# Patient Record
Sex: Male | Born: 1975 | Race: White | Hispanic: No | Marital: Married | Smoking: Current every day smoker
Health system: Southern US, Community
[De-identification: ages and names within clinical notes are randomized; demographics above are authoritative.]

---

## 2004-06-27 ENCOUNTER — Emergency Department: Payer: Self-pay | Admitting: Emergency Medicine

## 2006-06-11 ENCOUNTER — Ambulatory Visit: Payer: Self-pay | Admitting: Specialist

## 2008-03-03 ENCOUNTER — Emergency Department: Payer: Self-pay | Admitting: Emergency Medicine

## 2011-05-26 ENCOUNTER — Ambulatory Visit: Payer: Self-pay | Admitting: Family Medicine

## 2012-05-20 ENCOUNTER — Encounter (HOSPITAL_COMMUNITY): Payer: Self-pay

## 2012-05-20 ENCOUNTER — Emergency Department (INDEPENDENT_AMBULATORY_CARE_PROVIDER_SITE_OTHER)
Admission: EM | Admit: 2012-05-20 | Discharge: 2012-05-20 | Disposition: A | Discharge status: Home / Self Care | Payer: BC Managed Care – PPO | Source: Home / Self Care | Attending: Family Medicine | Admitting: Family Medicine

## 2012-05-20 DIAGNOSIS — S39012A Strain of muscle, fascia and tendon of lower back, initial encounter: Secondary | ICD-10-CM

## 2012-05-20 DIAGNOSIS — M549 Dorsalgia, unspecified: Secondary | ICD-10-CM

## 2012-05-20 MED ORDER — KETOROLAC TROMETHAMINE 60 MG/2ML IM SOLN
INTRAMUSCULAR | Status: AC
  Filled 2012-05-20: qty 2

## 2012-05-20 MED ORDER — CYCLOBENZAPRINE HCL 10 MG PO TABS: 10.0000 mg | ORAL_TABLET | Freq: Two times a day (BID) | ORAL | Status: DC | PRN

## 2012-05-20 MED ORDER — TRAMADOL HCL 50 MG PO TABS: 50.0000 mg | ORAL_TABLET | Freq: Four times a day (QID) | ORAL | Status: DC | PRN

## 2012-05-20 MED ORDER — METHYLPREDNISOLONE 4 MG PO KIT: PACK | ORAL | Status: DC

## 2012-05-20 MED ORDER — KETOROLAC TROMETHAMINE 60 MG/2ML IM SOLN
60.0000 mg | Freq: Once | INTRAMUSCULAR | Status: AC
  Administered 2012-05-20: 60 mg via INTRAMUSCULAR

## 2012-05-20 NOTE — ED Notes (Signed)
At d/c , pt upset he had not received a rx for narcotic for his back pain. I"LL NEVER COME BACK HERE AGAIN!!!

## 2012-05-20 NOTE — ED Notes (Addendum)
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 Reportedly felt sharp pain in back which shot into his buttocks and into his leg. "I've had back problems before, but this is the worst ever" Noted patient attempted to raise from prone position w/o significant observable discomfort; denies incontinence of stool or UA

## 2012-05-20 NOTE — ED Provider Notes (Signed)
History     CSN: [id number]  Arrival date & time 05/20/12  1015   None     Chief Complaint  Patient presents with  . Back Pain    (Consider location/radiation/quality/duration/timing/severity/associated sxs/prior treatment) Patient is a 36 y.o. male presenting with back pain. The history is provided by the patient.  Back Pain   SUBJECTIVE:  Kenneth Day is a 36 y.o. male who complains of an injury causing low back pain yesterday while lifting air conditioner unit off back of his truck.  The pain is positional with bending or lifting, without radiation down the legs. Mechanism of injury: twisting. Symptoms have been worsening since that time. Prior history of back problems include muscle strain 4 years ago.  Works at [employer].  There is no numbness in the legs.  Requests percocet for pain.  History reviewed. No pertinent past medical history.  History reviewed. No pertinent past surgical history.  History reviewed. No pertinent family history.  History  Substance Use Topics  . Smoking status: Never Smoker   . Smokeless tobacco: Not on file  . Alcohol Use: Yes      Review of Systems  Constitutional: Negative.   Respiratory: Negative.   Cardiovascular: Negative.   Musculoskeletal: Positive for back pain and gait problem. Negative for myalgias, joint swelling and arthralgias.  Neurological: Negative.     Allergies  Review of patient's allergies indicates no known allergies.  Home Medications   Current Outpatient Rx  Name Route Sig Dispense Refill  . CYCLOBENZAPRINE HCL 10 MG PO TABS Oral Take 1 tablet (10 mg total) by mouth 2 (two) times daily as needed for muscle spasms. 30 tablet 0  . METHYLPREDNISOLONE 4 MG PO KIT  Take as directed 21 tablet 0  . TRAMADOL HCL 50 MG PO TABS Oral Take 1 tablet (50 mg total) by mouth every 6 (six) hours as needed for pain. 10 tablet 0    BP 159/110  Pulse 84  Temp 98.1 F (36.7 C) (Oral)  Resp 18  SpO2 97%  Physical  Exam  Nursing note and vitals reviewed. Constitutional: He is oriented to person, place, and time. Vital signs are normal. He appears well-developed and well-nourished. He is active and cooperative.  HENT:  Head: Normocephalic.  Eyes: Conjunctivae normal are normal. Pupils are equal, round, and reactive to light. No scleral icterus.  Neck: Trachea normal. Neck supple.  Cardiovascular: Normal rate and regular rhythm.   Pulmonary/Chest: Effort normal and breath sounds normal.  Musculoskeletal:       Right hip: Normal.       Left hip: Normal.       Cervical back: Normal.       Thoracic back: Normal.       Lumbar back: He exhibits tenderness and spasm. He exhibits normal range of motion, no bony tenderness, no swelling, no edema, no deformity, no laceration, no pain and normal pulse.       Back:  Neurological: He is alert and oriented to person, place, and time. He has normal strength and normal reflexes. No cranial nerve deficit or sensory deficit. Coordination and gait normal. GCS eye subscore is 4. GCS verbal subscore is 5. GCS motor subscore is 6.  Skin: Skin is warm and dry.  Psychiatric: He has a normal mood and affect. His speech is normal and behavior is normal. Judgment and thought content normal. Cognition and memory are normal.    ED Course  Procedures (including critical care time)  Labs Reviewed - No data to display No results found.   1. Lumbosacral strain   2. Back pain       MDM  toradol 60mg  IM-pain somewhat improved.  Take medications as prescribed.  Follow up with primary care provider for further evaluation and management.          Johnsie Kindred, NP 05/22/12 1209

## 2012-05-22 NOTE — ED Provider Notes (Signed)
Medical screening examination/treatment/procedure(s) were performed by resident physician or non-physician practitioner and as supervising physician I was immediately available for consultation/collaboration.   KINDL,JAMES DOUGLAS MD.    James D Kindl, MD 05/22/12 1453 

## 2014-11-29 ENCOUNTER — Emergency Department
Admit: 2014-11-29 | Disposition: A | Discharge status: Home / Self Care | Payer: Self-pay | Admitting: Emergency Medicine

## 2017-08-11 ENCOUNTER — Encounter: Payer: Self-pay | Admitting: Emergency Medicine

## 2017-08-11 ENCOUNTER — Emergency Department: Payer: Self-pay

## 2017-08-11 ENCOUNTER — Other Ambulatory Visit: Payer: Self-pay

## 2017-08-11 ENCOUNTER — Emergency Department
Admission: EM | Admit: 2017-08-11 | Discharge: 2017-08-11 | Disposition: A | Discharge status: Home / Self Care | Payer: Self-pay | Attending: Emergency Medicine | Admitting: Emergency Medicine

## 2017-08-11 DIAGNOSIS — F419 Anxiety disorder, unspecified: Secondary | ICD-10-CM | POA: Insufficient documentation

## 2017-08-11 LAB — CBC WITH DIFFERENTIAL/PLATELET
Basophils Absolute: 0 10*3/uL (ref 0–0.1)
Basophils Relative: 1 %
Eosinophils Absolute: 0.1 10*3/uL (ref 0–0.7)
Eosinophils Relative: 1 %
HEMATOCRIT: 38.9 % — AB (ref 40.0–52.0)
Hemoglobin: 13.4 g/dL (ref 13.0–18.0)
LYMPHS ABS: 2.4 10*3/uL (ref 1.0–3.6)
LYMPHS PCT: 31 %
MCH: 31.3 pg (ref 26.0–34.0)
MCHC: 34.4 g/dL (ref 32.0–36.0)
MCV: 90.8 fL (ref 80.0–100.0)
Monocytes Absolute: 0.3 10*3/uL (ref 0.2–1.0)
Monocytes Relative: 4 %
NEUTROS ABS: 4.8 10*3/uL (ref 1.4–6.5)
NEUTROS PCT: 63 %
Platelets: 191 10*3/uL (ref 150–440)
RBC: 4.28 MIL/uL — ABNORMAL LOW (ref 4.40–5.90)
RDW: 12.9 % (ref 11.5–14.5)
WBC: 7.7 10*3/uL (ref 3.8–10.6)

## 2017-08-11 LAB — COMPREHENSIVE METABOLIC PANEL
ALK PHOS: 77 U/L (ref 38–126)
ALT: 12 U/L — ABNORMAL LOW (ref 17–63)
ANION GAP: 5 (ref 5–15)
AST: 16 U/L (ref 15–41)
Albumin: 4.2 g/dL (ref 3.5–5.0)
BILIRUBIN TOTAL: 0.4 mg/dL (ref 0.3–1.2)
BUN: 10 mg/dL (ref 6–20)
CALCIUM: 8.9 mg/dL (ref 8.9–10.3)
CO2: 26 mmol/L (ref 22–32)
Chloride: 105 mmol/L (ref 101–111)
Creatinine, Ser: 0.72 mg/dL (ref 0.61–1.24)
GFR calc non Af Amer: >60 mL/min (ref >60)
Glucose, Bld: 101 mg/dL — ABNORMAL HIGH (ref 65–99)
Potassium: 4.2 mmol/L (ref 3.5–5.1)
Sodium: 136 mmol/L (ref 135–145)
TOTAL PROTEIN: 6.9 g/dL (ref 6.5–8.1)

## 2017-08-11 LAB — TROPONIN I: Troponin I: <0.03 ng/mL (ref <0.03)

## 2017-08-11 LAB — TSH: TSH: 0.393 u[IU]/mL (ref 0.350–4.500)

## 2017-08-11 MED ORDER — LORAZEPAM 1 MG PO TABS: 1.0000 mg | ORAL_TABLET | Freq: Two times a day (BID) | ORAL | 0 refills | Status: AC

## 2017-08-11 MED ORDER — DIAZEPAM 5 MG PO TABS
10.0000 mg | ORAL_TABLET | Freq: Once | ORAL | Status: AC
  Administered 2017-08-11: 10 mg via ORAL
  Filled 2017-08-11: qty 2

## 2017-08-11 NOTE — ED Provider Notes (Signed)
[facility] Emergency Department Provider Note       Time seen: ----------------------------------------- 1:19 PM on 08/11/2017 -----------------------------------------   I have reviewed the triage vital signs and the nursing notes.  HISTORY   Chief Complaint Anxiety and Panic Attack    HPI Kenneth Day is a 42 y.o. male with no significant past medical history who presents to the ED for possible anxiety.  Patient reports intermittent episodes of shaking, shortness of breath and some chest tightness.  Patient states he feels anxious and has had these symptoms for 4-5 days.  He denies a history of anxiety but states it runs in his family.  He denies any recent illness or other complaints.  History reviewed. No pertinent past medical history.  There are no active problems to display for this patient.   No past surgical history on file.  Allergies Patient has no known allergies.  Social History Social History   Tobacco Use  . Smoking status: Never Smoker  Substance Use Topics  . Alcohol use: Yes  . Drug use: No    Review of Systems Constitutional: Negative for fever. Cardiovascular: Positive for chest tightness Respiratory: Positive for shortness of breath Gastrointestinal: Negative for abdominal pain, vomiting and diarrhea. Musculoskeletal: Negative for back pain. Skin: Negative for rash. Neurological: Negative for headaches, focal weakness or numbness. Psychiatric: Positive for anxiety, sleep loss All systems negative/normal/unremarkable except as stated in the HPI  ____________________________________________   PHYSICAL EXAM:  VITAL SIGNS: ED Triage Vitals [08/11/17 1221]  Enc Vitals Group     BP (!) 154/74     Pulse Rate 78     Resp 20     Temp 97.6 F (36.4 C)     Temp Source Oral     SpO2 99 %     Weight 185 lb (83.9 kg)     Height 6' (1.829 m)     Head Circumference      Peak Flow      Pain Score      Pain Loc       Pain Edu?      Excl. in GC?    Constitutional: Anxious, no distress Eyes: Conjunctivae are normal. Normal extraocular movements. ENT   Head: Normocephalic and atraumatic.   Nose: No congestion/rhinnorhea.   Mouth/Throat: Mucous membranes are moist.   Neck: No stridor. Cardiovascular: Normal rate, regular rhythm. No murmurs, rubs, or gallops. Respiratory: Normal respiratory effort without tachypnea nor retractions. Breath sounds are clear and equal bilaterally. No wheezes/rales/rhonchi. Gastrointestinal: Soft and nontender. Normal bowel sounds Musculoskeletal: Nontender with normal range of motion in extremities. No lower extremity tenderness nor edema. Neurologic:  Normal speech and language. No gross focal neurologic deficits are appreciated.  Skin:  Skin is warm, dry and intact. No rash noted. Psychiatric: Mood and affect are normal. Speech and behavior are normal.  ____________________________________________  EKG: Interpreted by me.  Sinus rhythm the rate of 70 bpm, normal PR interval, normal QRS, normal QT.  ____________________________________________  ED COURSE:  As part of my medical decision making, I reviewed the following data within the electronic MEDICAL RECORD NUMBER History obtained from family if available, nursing notes, old chart and ekg, as well as notes from prior ED visits. Patient presented for likely anxiety attack, we will assess with labs and imaging as indicated at this time.   Procedures ____________________________________________   LABS (pertinent positives/negatives)  Labs Reviewed  CBC WITH DIFFERENTIAL/PLATELET - Abnormal; Notable for the following components:  Result Value   RBC 4.28 (*)    HCT 38.9 (*)    All other components within normal limits  COMPREHENSIVE METABOLIC PANEL - Abnormal; Notable for the following components:   Glucose, Bld 101 (*)    ALT 12 (*)    All other components within normal limits  TROPONIN I  TSH     RADIOLOGY  Chest x-ray Is unremarkable ____________________________________________  DIFFERENTIAL DIAGNOSIS   Anxiety, arrhythmia, anemia, hyperthyroidism, drug or alcohol abuse  FINAL ASSESSMENT AND PLAN  Anxiety   Plan: Patient had presented for symptoms of anxiety and panic attacks. Patient's labs are reassuring. Patient's imaging was unremarkable.  I will prescribe Ativan he can take as needed.  He will be referred to primary care for outpatient follow-up.   Emily FilbertWilliams, Jonathan E, MD   Note: This note was generated in part or whole with voice recognition software. Voice recognition is usually quite accurate but there are transcription errors that can and very often do occur. I apologize for any typographical errors that were not detected and corrected.     Emily FilbertWilliams, Jonathan E, MD 08/11/17 [phone]

## 2017-08-11 NOTE — ED Triage Notes (Signed)
First nurse note: pt to ed with c/o shaking all over, weakness and sob.  Pt able to speak in full complete sentences at triage, appears in nad. Some notable tremors noted in pats hands.

## 2017-08-11 NOTE — ED Notes (Signed)
Pt taken to xray via stretcher  

## 2017-08-11 NOTE — ED Triage Notes (Signed)
Pt reports intermittent episodes of shaking, shortness of breath, feeling overwhelmed, and anxious. Pt reports symptoms for 4-5 days. Denies history of anxiety. Denies SI. Pt restless in triage.

## 2018-04-06 ENCOUNTER — Encounter: Payer: Self-pay | Admitting: Emergency Medicine

## 2018-04-06 ENCOUNTER — Emergency Department
Admission: EM | Admit: 2018-04-06 | Discharge: 2018-04-06 | Disposition: A | Discharge status: Home / Self Care | Payer: Self-pay | Attending: Emergency Medicine | Admitting: Emergency Medicine

## 2018-04-06 DIAGNOSIS — L03114 Cellulitis of left upper limb: Secondary | ICD-10-CM | POA: Insufficient documentation

## 2018-04-06 DIAGNOSIS — Z79899 Other long term (current) drug therapy: Secondary | ICD-10-CM | POA: Insufficient documentation

## 2018-04-06 MED ORDER — SULFAMETHOXAZOLE-TRIMETHOPRIM 800-160 MG PO TABS
1.0000 | ORAL_TABLET | Freq: Once | ORAL | Status: AC
  Administered 2018-04-06: 1 via ORAL
  Filled 2018-04-06: qty 1

## 2018-04-06 MED ORDER — SULFAMETHOXAZOLE-TRIMETHOPRIM 800-160 MG PO TABS: 1.0000 | ORAL_TABLET | Freq: Two times a day (BID) | ORAL | 0 refills | Status: DC

## 2018-04-06 NOTE — Discharge Instructions (Addendum)
Take the antibiotic as directed. Keep the area clean, dry, and covered. Wash only with soap & water. Follow-up with [facility] or return as needed.

## 2018-04-06 NOTE — ED Notes (Signed)
dsg applied to left forearm

## 2018-04-06 NOTE — ED Provider Notes (Signed)
[facility] Emergency Department Provider Note ____________________________________________  Time seen: 1247  I have reviewed the triage vital signs and the nursing notes.  HISTORY  Chief Complaint  Insect Bite and Abscess  HPI Kenneth Day is a 42 y.o. male who presents himself to the ED for evaluation of continued redness, swelling, and purulent drainage from his left forearm. He describes an insect bite to the arm while in the attic. He has been manipulating the area and noted some purulent drainage. He denies fevers, chills, and sweats. He has several other scabbed areas down the forearm.   History reviewed. No pertinent past medical history.  There are no active problems to display for this patient.  History reviewed. No pertinent surgical history.  Prior to Admission medications   Medication Sig Start Date End Date Taking? Authorizing Provider  cyclobenzaprine (FLEXERIL) 10 MG tablet Take 1 tablet (10 mg total) by mouth 2 (two) times daily as needed for muscle spasms. 05/20/12   Chatten, Katherine Basset, NP  LORazepam (ATIVAN) 1 MG tablet Take 1 tablet (1 mg total) by mouth 2 (two) times daily. 08/11/17 08/11/18  Emily Filbert, MD  methylPREDNISolone (MEDROL DOSEPAK) 4 MG tablet Take as directed 05/20/12   Chatten, Katherine Basset, NP  sulfamethoxazole-trimethoprim (BACTRIM DS,SEPTRA DS) 800-160 MG tablet Take 1 tablet by mouth 2 (two) times daily. 04/06/18   Abcde Oneil, Charlesetta Ivory, PA-C  traMADol (ULTRAM) 50 MG tablet Take 1 tablet (50 mg total) by mouth every 6 (six) hours as needed for pain. 05/20/12   Johnsie Kindred, NP    Allergies Patient has no known allergies.  No family history on file.  Social History Social History   Tobacco Use  . Smoking status: Never Smoker  Substance Use Topics  . Alcohol use: Yes  . Drug use: No    Review of Systems  Constitutional: Negative for fever. Cardiovascular: Negative for chest pain. Respiratory:  Negative for shortness of breath. Musculoskeletal: Negative for back pain. Skin: Negative for rash. Left forearm cellulitis Neurological: Negative for headaches, focal weakness or numbness. ____________________________________________  PHYSICAL EXAM:  VITAL SIGNS: ED Triage Vitals  Enc Vitals Group     BP 04/06/18 1159 139/63     Pulse Rate 04/06/18 1156 93     Resp 04/06/18 1156 20     Temp 04/06/18 1156 98.2 F (36.8 C)     Temp Source 04/06/18 1156 Oral     SpO2 04/06/18 1156 99 %     Weight 04/06/18 1158 190 lb (86.2 kg)     Height 04/06/18 1158 6' (1.829 m)     Head Circumference --      Peak Flow --      Pain Score 04/06/18 1158 10     Pain Loc --      Pain Edu? --      Excl. in GC? --     Constitutional: Alert and oriented. Well appearing and in no distress. Head: Normocephalic and atraumatic. Eyes: Conjunctivae are normal. Normal extraocular movements Hematological/Lymphatic/Immunological: No epitrochlear or axillary lymphadenopathy. Cardiovascular: Normal rate, regular rhythm. Normal distal pulses. Respiratory: Normal respiratory effort. No wheezes/rales/rhonchi. Musculoskeletal: Nontender with normal range of motion in all extremities.  Neurologic:  Normal gait without ataxia. Normal speech and language. No gross focal neurologic deficits are appreciated. Skin:  Skin is warm, dry and intact. No rash noted.  Left forearm with a central ulcerated lesion with early eschar noted.  There is some mild purulent drainage with  manipulation.  There is surrounding erythema induration from the area.  No lymphangitis, pustule, pointing, or fluctuance is appreciated.  There are other excoriated and scabbed lesions to the lower forearm noted. ____________________________________________  PROCEDURES  Procedures Bactrim DS 1 PO Dressing applied ____________________________________________  INITIAL IMPRESSION / ASSESSMENT AND PLAN / ED COURSE  She with ED evaluation of a  1/2-week complaint of worsening redness, swelling, and purulence to the left forearm.  Patient is consistent with a local cellulitis.  There is no pointing fluctuant abscess requiring I&D at this time.  Patient is encouraged to keep the wound clean, dry, and covered.  He will start on Bactrim as prescribed.  He should follow-up with [facility] or return to the ED as needed. ____________________________________________  FINAL CLINICAL IMPRESSION(S) / ED DIAGNOSES  Final diagnoses:  Cellulitis of left upper extremity      Karmen Stabs, Charlesetta Ivory, PA-C 04/06/18 1323    Emily Filbert, MD 04/06/18 [phone]

## 2018-04-06 NOTE — ED Notes (Signed)
Pt reports spider bite that occurred 1 1/2 weeks ago located on left forearm - the area is open and draining in the center with red induration x2 days - pt reports that he removed the core of the bite/sore earlier today

## 2018-04-06 NOTE — ED Triage Notes (Signed)
Pt reports insect bit to left forearm for past couple of weeks. Pt also with multiple smaller abscesses to left wrist.

## 2019-12-27 ENCOUNTER — Emergency Department
Admission: EM | Admit: 2019-12-27 | Discharge: 2019-12-27 | Disposition: A | Discharge status: Home / Self Care | Payer: Self-pay | Attending: Emergency Medicine | Admitting: Emergency Medicine

## 2019-12-27 ENCOUNTER — Other Ambulatory Visit: Payer: Self-pay

## 2019-12-27 DIAGNOSIS — R509 Fever, unspecified: Secondary | ICD-10-CM | POA: Insufficient documentation

## 2019-12-27 DIAGNOSIS — Z5321 Procedure and treatment not carried out due to patient leaving prior to being seen by health care provider: Secondary | ICD-10-CM | POA: Insufficient documentation

## 2019-12-27 DIAGNOSIS — R42 Dizziness and giddiness: Secondary | ICD-10-CM | POA: Insufficient documentation

## 2019-12-27 DIAGNOSIS — R21 Rash and other nonspecific skin eruption: Secondary | ICD-10-CM | POA: Insufficient documentation

## 2019-12-27 LAB — BASIC METABOLIC PANEL
Anion gap: 9 (ref 5–15)
BUN: 10 mg/dL (ref 6–20)
CO2: 23 mmol/L (ref 22–32)
Calcium: 8.5 mg/dL — ABNORMAL LOW (ref 8.9–10.3)
Chloride: 99 mmol/L (ref 98–111)
Creatinine, Ser: 0.79 mg/dL (ref 0.61–1.24)
GFR calc Af Amer: >60 mL/min (ref >60)
GFR calc non Af Amer: >60 mL/min (ref >60)
Glucose, Bld: 149 mg/dL — ABNORMAL HIGH (ref 70–99)
Potassium: 3.8 mmol/L (ref 3.5–5.1)
Sodium: 131 mmol/L — ABNORMAL LOW (ref 135–145)

## 2019-12-27 LAB — LACTIC ACID, PLASMA: Lactic Acid, Venous: 1.7 mmol/L (ref 0.5–1.9)

## 2019-12-27 LAB — CBC
HCT: 34.6 % — ABNORMAL LOW (ref 39.0–52.0)
Hemoglobin: 11.3 g/dL — ABNORMAL LOW (ref 13.0–17.0)
MCH: 28.5 pg (ref 26.0–34.0)
MCHC: 32.7 g/dL (ref 30.0–36.0)
MCV: 87.4 fL (ref 80.0–100.0)
Platelets: 145 10*3/uL — ABNORMAL LOW (ref 150–400)
RBC: 3.96 MIL/uL — ABNORMAL LOW (ref 4.22–5.81)
RDW: 13.1 % (ref 11.5–15.5)
WBC: 11.2 10*3/uL — ABNORMAL HIGH (ref 4.0–10.5)
nRBC: 0 % (ref 0.0–0.2)

## 2019-12-27 MED ORDER — ACETAMINOPHEN 500 MG PO TABS
1000.0000 mg | ORAL_TABLET | Freq: Once | ORAL | Status: AC
  Administered 2019-12-27: 1000 mg via ORAL
  Filled 2019-12-27: qty 2

## 2019-12-27 MED ORDER — SODIUM CHLORIDE 0.9% FLUSH: 3.0000 mL | Freq: Once | INTRAVENOUS | Status: DC

## 2019-12-27 NOTE — ED Triage Notes (Addendum)
Pt comes via POV from home with c/o rash to right hand that started a few weeks ago. Pt also c/o fever with current temp of 103.3.  Pt states some dizziness  Pt also states lump under right arm that is painful.  Pt states he goes to a pain clinic and takes methadone. Pt states he just went this am.  Pt has redness noted to right hand and open sores. Pt has glove covering hand and states it is really bad. Pt has warmth and swelling noted under arm to touch

## 2019-12-27 NOTE — ED Notes (Addendum)
Pt called out from subwait area asking for his IV to be removed. Pt states he is wanting to go home because he needs to work in the morning and is hungry . Pt informed of risks associated with leaving with his current symptoms and pt verbalized understanding. Pt encouraged to seek medical attention if symptoms continue or worsen in any way. Pt states he will probably go to urgent care in the morning. IV removed (20 gauge in his left AC) and bandage applied with no bleeding noted.

## 2019-12-28 ENCOUNTER — Other Ambulatory Visit: Payer: Self-pay

## 2019-12-28 ENCOUNTER — Encounter: Payer: Self-pay | Admitting: Emergency Medicine

## 2019-12-28 ENCOUNTER — Emergency Department: Payer: Self-pay

## 2019-12-28 ENCOUNTER — Inpatient Hospital Stay
Admission: EM | Admit: 2019-12-28 | Discharge: 2019-12-28 | DRG: 603 | Discharge status: Against Medical Advice | Payer: Self-pay | Attending: Internal Medicine | Admitting: Internal Medicine

## 2019-12-28 DIAGNOSIS — A419 Sepsis, unspecified organism: Secondary | ICD-10-CM

## 2019-12-28 DIAGNOSIS — L03119 Cellulitis of unspecified part of limb: Secondary | ICD-10-CM | POA: Insufficient documentation

## 2019-12-28 DIAGNOSIS — M79621 Pain in right upper arm: Secondary | ICD-10-CM

## 2019-12-28 DIAGNOSIS — R000 Tachycardia, unspecified: Secondary | ICD-10-CM | POA: Diagnosis present

## 2019-12-28 DIAGNOSIS — R21 Rash and other nonspecific skin eruption: Secondary | ICD-10-CM | POA: Diagnosis present

## 2019-12-28 DIAGNOSIS — Z79899 Other long term (current) drug therapy: Secondary | ICD-10-CM

## 2019-12-28 DIAGNOSIS — Z5329 Procedure and treatment not carried out because of patient's decision for other reasons: Secondary | ICD-10-CM | POA: Diagnosis not present

## 2019-12-28 DIAGNOSIS — L0390 Cellulitis, unspecified: Secondary | ICD-10-CM | POA: Diagnosis present

## 2019-12-28 DIAGNOSIS — M79601 Pain in right arm: Secondary | ICD-10-CM

## 2019-12-28 DIAGNOSIS — Z20822 Contact with and (suspected) exposure to covid-19: Secondary | ICD-10-CM | POA: Diagnosis present

## 2019-12-28 DIAGNOSIS — L03113 Cellulitis of right upper limb: Principal | ICD-10-CM

## 2019-12-28 LAB — CBC WITH DIFFERENTIAL/PLATELET
Abs Immature Granulocytes: 0.08 10*3/uL — ABNORMAL HIGH (ref 0.00–0.07)
Basophils Absolute: 0 10*3/uL (ref 0.0–0.1)
Basophils Relative: 0 %
Eosinophils Absolute: 0 10*3/uL (ref 0.0–0.5)
Eosinophils Relative: 0 %
HCT: 34.8 % — ABNORMAL LOW (ref 39.0–52.0)
Hemoglobin: 11.6 g/dL — ABNORMAL LOW (ref 13.0–17.0)
Immature Granulocytes: 1 %
Lymphocytes Relative: 5 %
Lymphs Abs: 0.7 10*3/uL (ref 0.7–4.0)
MCH: 28.6 pg (ref 26.0–34.0)
MCHC: 33.3 g/dL (ref 30.0–36.0)
MCV: 85.7 fL (ref 80.0–100.0)
Monocytes Absolute: 0.5 10*3/uL (ref 0.1–1.0)
Monocytes Relative: 4 %
Neutro Abs: 13.2 10*3/uL — ABNORMAL HIGH (ref 1.7–7.7)
Neutrophils Relative %: 90 %
Platelets: 140 10*3/uL — ABNORMAL LOW (ref 150–400)
RBC: 4.06 MIL/uL — ABNORMAL LOW (ref 4.22–5.81)
RDW: 13.2 % (ref 11.5–15.5)
Smear Review: NORMAL
WBC: 14.5 10*3/uL — ABNORMAL HIGH (ref 4.0–10.5)
nRBC: 0 % (ref 0.0–0.2)

## 2019-12-28 LAB — URINALYSIS, COMPLETE (UACMP) WITH MICROSCOPIC
Bacteria, UA: NONE SEEN
Bilirubin Urine: NEGATIVE
Glucose, UA: NEGATIVE mg/dL
Hgb urine dipstick: NEGATIVE
Ketones, ur: NEGATIVE mg/dL
Nitrite: NEGATIVE
Protein, ur: NEGATIVE mg/dL
Specific Gravity, Urine: 1.01 (ref 1.005–1.030)
Squamous Epithelial / HPF: NONE SEEN (ref 0–5)
pH: 6 (ref 5.0–8.0)

## 2019-12-28 LAB — COMPREHENSIVE METABOLIC PANEL
ALT: 35 U/L (ref 0–44)
AST: 24 U/L (ref 15–41)
Albumin: 3.7 g/dL (ref 3.5–5.0)
Alkaline Phosphatase: 123 U/L (ref 38–126)
Anion gap: 9 (ref 5–15)
BUN: 10 mg/dL (ref 6–20)
CO2: 24 mmol/L (ref 22–32)
Calcium: 8.6 mg/dL — ABNORMAL LOW (ref 8.9–10.3)
Chloride: 98 mmol/L (ref 98–111)
Creatinine, Ser: 0.72 mg/dL (ref 0.61–1.24)
GFR calc Af Amer: >60 mL/min (ref >60)
GFR calc non Af Amer: >60 mL/min (ref >60)
Glucose, Bld: 115 mg/dL — ABNORMAL HIGH (ref 70–99)
Potassium: 3.5 mmol/L (ref 3.5–5.1)
Sodium: 131 mmol/L — ABNORMAL LOW (ref 135–145)
Total Bilirubin: 0.6 mg/dL (ref 0.3–1.2)
Total Protein: 7.6 g/dL (ref 6.5–8.1)

## 2019-12-28 LAB — LACTIC ACID, PLASMA: Lactic Acid, Venous: 1.1 mmol/L (ref 0.5–1.9)

## 2019-12-28 LAB — HIV ANTIBODY (ROUTINE TESTING W REFLEX): HIV Screen 4th Generation wRfx: NONREACTIVE

## 2019-12-28 LAB — SARS CORONAVIRUS 2 BY RT PCR (HOSPITAL ORDER, PERFORMED IN ~~LOC~~ HOSPITAL LAB): SARS Coronavirus 2: NEGATIVE

## 2019-12-28 MED ORDER — METHADONE HCL 10 MG/ML PO CONC: 140.0000 mg | Freq: Every day | ORAL | Status: DC

## 2019-12-28 MED ORDER — HYDROCODONE-ACETAMINOPHEN 5-325 MG PO TABS
1.0000 | ORAL_TABLET | Freq: Four times a day (QID) | ORAL | Status: DC | PRN
  Administered 2019-12-28: 1 via ORAL
  Filled 2019-12-28: qty 1

## 2019-12-28 MED ORDER — ONDANSETRON HCL 4 MG/2ML IJ SOLN: 4.0000 mg | Freq: Four times a day (QID) | INTRAMUSCULAR | Status: DC | PRN

## 2019-12-28 MED ORDER — SODIUM CHLORIDE 0.9 % IV SOLN
2.0000 g | INTRAVENOUS | Status: DC
  Administered 2019-12-28: 2 g via INTRAVENOUS
  Filled 2019-12-28 (×2): qty 20

## 2019-12-28 MED ORDER — ACETAMINOPHEN 650 MG RE SUPP: 650.0000 mg | Freq: Four times a day (QID) | RECTAL | Status: DC | PRN

## 2019-12-28 MED ORDER — SODIUM CHLORIDE 0.9 % IV BOLUS
1000.0000 mL | Freq: Once | INTRAVENOUS | Status: AC
  Administered 2019-12-28: 1000 mL via INTRAVENOUS

## 2019-12-28 MED ORDER — ACETAMINOPHEN 325 MG PO TABS: 650.0000 mg | ORAL_TABLET | Freq: Four times a day (QID) | ORAL | Status: DC | PRN

## 2019-12-28 MED ORDER — CEFAZOLIN SODIUM-DEXTROSE 1-4 GM/50ML-% IV SOLN: 1.0000 g | Freq: Three times a day (TID) | INTRAVENOUS | Status: DC

## 2019-12-28 MED ORDER — ENOXAPARIN SODIUM 40 MG/0.4ML ~~LOC~~ SOLN: 40.0000 mg | SUBCUTANEOUS | Status: DC

## 2019-12-28 MED ORDER — CYCLOBENZAPRINE HCL 10 MG PO TABS
10.0000 mg | ORAL_TABLET | Freq: Two times a day (BID) | ORAL | Status: DC | PRN
  Filled 2019-12-28: qty 1

## 2019-12-28 MED ORDER — ONDANSETRON HCL 4 MG PO TABS: 4.0000 mg | ORAL_TABLET | Freq: Four times a day (QID) | ORAL | Status: DC | PRN

## 2019-12-28 NOTE — ED Triage Notes (Addendum)
Patient to ER for infection to skin on right hand. 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 Patient states he has been experiencing weakness x2 days as well. Was seen here last night for triage and protocols, but left due to long wait. Also c/o "knot" to right axillary region.

## 2019-12-28 NOTE — ED Notes (Signed)
Pt given meal tray at this time 

## 2019-12-28 NOTE — ED Notes (Signed)
See downtime charting. 

## 2019-12-28 NOTE — ED Notes (Signed)
This RN to bedside, pt given urinal. Pt requesting to see admitting MD to leave. Pt states "nobody has been in here since 2 o'clock, nobody has even told me about my medicine for the morning, nobody even gave me a urinal and I told you I had to piss an hour ago". This RN explained when this RN checked on patient that patient was asleep and continued to be asleep until patient woke up and this RN came to bedside. Pt states he is angry regarding this RN not telling him about his medications for tomorrow, this RN explained that conversations regarding medications needed to be had with admitting MD, pt states admitting MD had not seen him, this RN reminded patient that prior in shift pt stated admitting had seen him. Pt states "I just want to go home, that other doctor could tell me what I took". This RN explained to patient RN could not order medications, medications were ordered by MD, and patient still had not explained to this RN what medication he wanted ordered, pt still does not state which medication he wants, states "just get me the doctor, I want to go home". Admitting MD notified via secure chat patient requesting to leave.

## 2019-12-28 NOTE — H&P (Addendum)
History and Physical    Kenneth Day:[id number] DOB: 05-02-1976 DOA: 12/28/2019  PCP: Patient, No Pcp Per   Patient coming from: Home  I have personally briefly reviewed patient's old medical records in [facility]  Chief Complaint: Right arm pain and swelling  HPI: Kenneth Day is a 44 y.o. male with no significant medical history who presents to the emergency room for evaluation of areas of skin break, renal redness and swelling involving his right hand.  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  He developed fever and and a knot in his right axilla for which he presented to the emergency room yesterday but left without being seen.  He had a temp of 103F when he came to the ER yesterday. On arrival today he was tachycardic but is afebrile. Labs reveal a white count of 14,000 He denies having any cough, no nausea, no vomiting, no abdominal pain, no changes in his bowel habits, no urinary symptoms, no dizziness or lightheadedness.  ED Course: Patient seen in the emergency room twice within the last 24 hours for evaluation of redness and a rash over his right hand consistent with cellulitis.  He had a fever with a T-max of 103 last 12 hours.  Review of Systems: As per HPI otherwise 10 point review of systems negative.    History reviewed. No pertinent past medical history.  History reviewed. No pertinent surgical history.   reports that he has never smoked. He uses smokeless tobacco. He reports previous drug use. He reports that he does not drink alcohol.  No Known Allergies  No family history on file.   Prior to Admission medications   Medication Sig Start Date End Date Taking? Authorizing Provider  cyclobenzaprine (FLEXERIL) 10 MG tablet Take 1 tablet (10 mg total) by mouth 2 (two) times daily as needed for muscle spasms. 05/20/12   Chatten, Katherine Basset, NP  methylPREDNISolone (MEDROL DOSEPAK) 4 MG  tablet Take as directed 05/20/12   Chatten, Porfirio Mylar L, NP  sulfamethoxazole-trimethoprim (BACTRIM DS,SEPTRA DS) 800-160 MG tablet Take 1 tablet by mouth 2 (two) times daily. 04/06/18   Menshew, Charlesetta Ivory, PA-C  traMADol (ULTRAM) 50 MG tablet Take 1 tablet (50 mg total) by mouth every 6 (six) hours as needed for pain. 05/20/12   Johnsie Kindred, NP    Physical Exam: Vitals:   12/28/19 0717 12/28/19 0718 12/28/19 1042  BP: 116/69  104/70  Pulse: (!) 102  92  Resp: 20  20  Temp: 97.8 F (36.6 C)    TempSrc: Oral    SpO2: 100%  98%  Weight:  88.5 kg   Height:  6' (1.829 m)      Vitals:   12/28/19 0717 12/28/19 0718 12/28/19 1042  BP: 116/69  104/70  Pulse: (!) 102  92  Resp: 20  20  Temp: 97.8 F (36.6 C)    TempSrc: Oral    SpO2: 100%  98%  Weight:  88.5 kg   Height:  6' (1.829 m)     Constitutional: NAD, alert and oriented x 3.  Appears to be in pain-I have a T please Eyes: PERRL, lids and conjunctivae normal ENMT: Mucous membranes are moist.  Neck: normal, supple, no masses, no thyromegaly Respiratory: clear to auscultation bilaterally, no wheezing, no crackles. Normal respiratory effort. No accessory muscle use. Cardiovascular: Regular rate and rhythm, no murmurs / rubs /  gallops. No extremity edema. 2+ pedal pulses. No carotid bruits.  Abdomen: no tenderness, no masses palpated. No hepatosplenomegaly. Bowel sounds positive.  Musculoskeletal: no clubbing / cyanosis. No joint deformity upper and lower extremities.  Skin: Redness over the right hand mostly involving the medial portion of the right hand (involving the right thumb and second right finger extending to the wrist) Neurologic: No gross focal neurologic deficit. Psychiatric: Normal mood and affect.   Labs on Admission: I have personally reviewed following labs and imaging studies  CBC: Recent Labs  Lab 12/27/19 1805 12/28/19 0723  WBC 11.2* 14.5*  NEUTROABS  --  13.2*  HGB 11.3* 11.6*  HCT 34.6*  34.8*  MCV 87.4 85.7  PLT 145* 258*   Basic Metabolic Panel: Recent Labs  Lab 12/27/19 1805 12/28/19 0723  NA 131* 131*  K 3.8 3.5  CL 99 98  CO2 23 24  GLUCOSE 149* 115*  BUN 10 10  CREATININE 0.79 0.72  CALCIUM 8.5* 8.6*   GFR: Estimated Creatinine Clearance: 129.3 mL/min (by C-G formula based on SCr of 0.72 mg/dL). Liver Function Tests: Recent Labs  Lab 12/28/19 0723  AST 24  ALT 35  ALKPHOS 123  BILITOT 0.6  PROT 7.6  ALBUMIN 3.7   No results for input(s): LIPASE, AMYLASE in the last 168 hours. No results for input(s): AMMONIA in the last 168 hours. Coagulation Profile: No results for input(s): INR, PROTIME in the last 168 hours. Cardiac Enzymes: No results for input(s): CKTOTAL, CKMB, CKMBINDEX, TROPONINI in the last 168 hours. BNP (last 3 results) No results for input(s): PROBNP in the last 8760 hours. HbA1C: No results for input(s): HGBA1C in the last 72 hours. CBG: No results for input(s): GLUCAP in the last 168 hours. Lipid Profile: No results for input(s): CHOL, HDL, LDLCALC, TRIG, CHOLHDL, LDLDIRECT in the last 72 hours. Thyroid Function Tests: No results for input(s): TSH, T4TOTAL, FREET4, T3FREE, THYROIDAB in the last 72 hours. Anemia Panel: No results for input(s): VITAMINB12, FOLATE, FERRITIN, TIBC, IRON, RETICCTPCT in the last 72 hours. Urine analysis:    Component Value Date/Time   COLORURINE YELLOW (A) 12/28/2019 0720   APPEARANCEUR HAZY (A) 12/28/2019 0720   LABSPEC 1.010 12/28/2019 0720   PHURINE 6.0 12/28/2019 0720   GLUCOSEU NEGATIVE 12/28/2019 0720   HGBUR NEGATIVE 12/28/2019 0720   BILIRUBINUR NEGATIVE 12/28/2019 0720   KETONESUR NEGATIVE 12/28/2019 0720   PROTEINUR NEGATIVE 12/28/2019 0720   NITRITE NEGATIVE 12/28/2019 0720   LEUKOCYTESUR SMALL (A) 12/28/2019 0720    Radiological Exams on Admission: [place] AXILLA RIGHT  Result Date: 12/28/2019 CLINICAL DATA:  RIGHT axillary pain question abscess; patient with RIGHT hand  infection/cellulitis EXAM: ULTRASOUND OF THE RIGHT AXILLA COMPARISON:  None FINDINGS: Sonography performed at site of symptoms at the RIGHT axilla. Small normal sized axillary lymph node with normal morphology noted at RIGHT axilla. In addition, a minimally enlarged RIGHT axillary lymph node is identified measuring 23 x 12 x 15 mm, corresponding to site of patient's pain. This node demonstrates abnormal morphology, lacking a fatty hilum, with diffuse cortical thickening and mild lobulation noted. This could be due to a reactive node in the setting of an acute inflammatory process but can be seen with other etiologies including lymphoma and metastatic disease. IMPRESSION: Mildly enlarged abnormal lymph node 23 x 12 x 15 mm at site of symptoms in RIGHT axilla. In the setting of acute infection in the RIGHT upper extremity, this most likely represents an enlarged reactive lymph node. This requires  clinical follow-up until resolution to exclude other etiologies, since lymphoma and metastatic disease can cause abnormal appearing lymph nodes with a similar appearance. Also, recommend correlation with breast exam to exclude breast etiology. Electronically Signed   By: Ulyses Southward M.D.   On: 12/28/2019 10:44    EKG: Independently reviewed.  Sinus Tachycardia  Assessment/Plan Principal Problem:   Cellulitis of hand, right Active Problems:   Cellulitis    Right hand cellulitis With failure of outpatient antibiotic therapy Patient presents to the emergency room due to pain, swelling and redness involving the right hand, he also has areas of skin tear/breaks We will place patient on IV antibiotic therapy Pain control  DVT prophylaxis: Lovenox Code Status: Full Family Communication: Greater than 50% of time was spent discussing plan of care with patient at the bedside.  All questions and concerns have been addressed. Disposition Plan: Back to previous home environment Consults called: None     Addeline Calarco MD [facility]     12/28/2019, 2:26 PM

## 2019-12-28 NOTE — ED Notes (Signed)
Admitting MD aware that patient stating he wants to leave. Per Admitting MD, pt okay to sign AMA papers at this time.

## 2019-12-28 NOTE — ED Notes (Signed)
Lab called to add HIV antibody on to previously sent blood work at this time.

## 2019-12-28 NOTE — ED Notes (Signed)
This RN spoke with EDP regarding patient statements concerning wanting to leave despite EDP speaking with patient already regarding admission. Pt asking this RN if he can do abx outpatient due to needing to work, asking if he really needs to stay or needs IV. This RN spoke with EDP at this time, per EDP, okay to hold on drawing blood cultures and administering abx and fluids at this time, states will speak with patient after [place] read.

## 2019-12-28 NOTE — ED Notes (Signed)
This RN and Marisue Humble, RN to bedside to take patient to CPod, pt noted to have eloped from room. Pt not visualized in room at this time. Charge RN, and First RN made aware that patient eloped with IV in place. Pt left cell phone and headphones in room, pt's belongings placed at charge desk, first RN and Charge RN made aware that if patient returns from personal belongings to check and make sure patient removed IV. [employer] made aware of patient elopement with IV in place as well.

## 2019-12-28 NOTE — ED Notes (Signed)
This RN to bedside, explained to patient pharmacy tech was verifying dose of methadone. Pt states "I'm funny about people knowing that, so don't say it out loud". Marisue Humble, RN to bedside to move patient to CPod. Pt refusing to move due to "just sitting down to eat". Marisue Humble, RN states will return to move patient to CPod.

## 2019-12-28 NOTE — ED Provider Notes (Signed)
[facility] Emergency Department Provider Note  ____________________________________________   None    (approximate)  I have reviewed the triage vital signs and the nursing notes.   HISTORY  Chief Complaint Cellulitis    HPI Kenneth Day is a 44 y.o. male otherwise healthy who comes in with cellulitis.   2 days ago he noted a fever and a knot under her R arm pit.  Also has a rash on his right hand. The rash on hand has been there for weeks. He does heating and air for a living. He scraped it under a house a few weeks ago and then noticed an infection. Has not seen a doctor for it previously.  Here last night and fever 103. This was confirmed because he was seen yesterday and MSE had fever 103.           History reviewed. No pertinent past medical history.  There are no problems to display for this patient.   History reviewed. No pertinent surgical history.  Prior to Admission medications   Medication Sig Start Date End Date Taking? Authorizing Provider  cyclobenzaprine (FLEXERIL) 10 MG tablet Take 1 tablet (10 mg total) by mouth 2 (two) times daily as needed for muscle spasms. 05/20/12   Chatten, Katherine Basset, NP  methylPREDNISolone (MEDROL DOSEPAK) 4 MG tablet Take as directed 05/20/12   Chatten, Porfirio Mylar L, NP  sulfamethoxazole-trimethoprim (BACTRIM DS,SEPTRA DS) 800-160 MG tablet Take 1 tablet by mouth 2 (two) times daily. 04/06/18   Menshew, Charlesetta Ivory, PA-C  traMADol (ULTRAM) 50 MG tablet Take 1 tablet (50 mg total) by mouth every 6 (six) hours as needed for pain. 05/20/12   Johnsie Kindred, NP    Allergies Patient has no known allergies.  No family history on file.  Social History Social History   Tobacco Use  . Smoking status: Never Smoker  . Smokeless tobacco: Current User  Substance Use Topics  . Alcohol use: Never  . Drug use: Not Currently    Comment: methadone clinic      Review of Systems Constitutional: + fevers    Eyes: No visual changes. ENT: No sore throat. Cardiovascular: Denies chest pain. Respiratory: Denies shortness of breath. Gastrointestinal: No abdominal pain.  No nausea, no vomiting.  No diarrhea.  No constipation. Genitourinary: Negative for dysuria. Musculoskeletal: Negative for back pain. +cellulitis  Skin: + rash  Neurological: Negative for headaches, focal weakness or numbness. All other ROS negative ____________________________________________   PHYSICAL EXAM:  VITAL SIGNS: ED Triage Vitals  Enc Vitals Group     BP 12/28/19 0717 116/69     Pulse Rate 12/28/19 0717 (!) 102     Resp 12/28/19 0717 20     Temp 12/28/19 0717 97.8 F (36.6 C)     Temp Source 12/28/19 0717 Oral     SpO2 12/28/19 0717 100 %     Weight 12/28/19 0718 195 lb 1.7 oz (88.5 kg)     Height 12/28/19 0718 6' (1.829 m)     Head Circumference --      Peak Flow --      Pain Score 12/28/19 0718 8     Pain Loc --      Pain Edu? --      Excl. in GC? --     Constitutional: Alert and oriented. Well appearing and in no acute distress. Eyes: Conjunctivae are normal. EOMI. Head: Atraumatic. Nose: No congestion/rhinnorhea. Mouth/Throat: Mucous membranes are moist.   Neck: No  stridor. Trachea Midline. FROM Cardiovascular: Normal rate, regular rhythm. Grossly normal heart sounds.  Good peripheral circulation. Respiratory: Normal respiratory effort.  No retractions. Lungs CTAB. Gastrointestinal: Soft and nontender. No distention. No abdominal bruits.  Musculoskeletal: No lower extremity tenderness nor edema.  Redness, erythema, open sores to the right hand. Full ROM of joints. No abscess palpated under the right arm pit.  Neurologic:  Normal speech and language. No gross focal neurologic deficits are appreciated.  Skin:  Skin is warm, dry and intact. + rash  Psychiatric: Mood and affect are normal. Speech and behavior are normal. GU: Deferred   ____________________________________________   LABS (all  labs ordered are listed, but only abnormal results are displayed)  Labs Reviewed  COMPREHENSIVE METABOLIC PANEL - Abnormal; Notable for the following components:      Result Value   Sodium 131 (*)    Glucose, Bld 115 (*)    Calcium 8.6 (*)    All other components within normal limits  CBC WITH DIFFERENTIAL/PLATELET - Abnormal; Notable for the following components:   WBC 14.5 (*)    RBC 4.06 (*)    Hemoglobin 11.6 (*)    HCT 34.8 (*)    Platelets 140 (*)    Neutro Abs 13.2 (*)    Abs Immature Granulocytes 0.08 (*)    All other components within normal limits  URINALYSIS, COMPLETE (UACMP) WITH MICROSCOPIC - Abnormal; Notable for the following components:   Color, Urine YELLOW (*)    APPearance HAZY (*)    Leukocytes,Ua SMALL (*)    All other components within normal limits  LACTIC ACID, PLASMA  LACTIC ACID, PLASMA   ____________________________________________   ED ECG REPORT I, Concha Se, the attending physician, personally viewed and interpreted this ECG.  Sinus tachy rate of 102, no st elevation, no twi, normal intervals/.  ____________________________________________  RADIOLOGY Vela Prose, personally viewed and evaluated these images (plain radiographs) as part of my medical decision making, as well as reviewing the written report by the radiologist.  ED MD interpretation:   Mildly enlarged abnormal lymph node 23 x 12 x 15 mm at site of symptoms in RIGHT axilla.  In the setting of acute infection in the RIGHT upper extremity, this most likely represents an enlarged reactive lymph node.  This requires clinical follow-up until resolution to exclude other etiologies, since lymphoma and metastatic disease can cause abnormal appearing lymph nodes with a similar appearance.  Also, recommend correlation with breast exam to exclude breast etiology.  Official radiology report(s): No results  found.  ____________________________________________   PROCEDURES  Procedure(s) performed (including Critical Care):  Procedures   ____________________________________________   INITIAL IMPRESSION / ASSESSMENT AND PLAN / ED COURSE  Kenneth Day was evaluated in Emergency Department on 12/28/2019 for the symptoms described in the history of present illness. He was evaluated in the context of the global COVID-19 pandemic, which necessitated consideration that the patient might be at risk for infection with the SARS-CoV-2 virus that causes COVID-19. Institutional protocols and algorithms that pertain to the evaluation of patients at risk for COVID-19 are in a state of rapid change based on information released by regulatory bodies including the CDC and federal and state organizations. These policies and algorithms were followed during the patient's care in the ED.    Patient is a 44 year old gentleman with a history of opioid abuse, denies IV drugs who comes in with cellulitis to his right hand.  Patient was febrile to 103 noted in  triage yesterday but left before treatment was complete.  Patient has a elevated white count and tachycardic concerning for the sepsis.  Patient started on antibiotics to cover for the cellulitis.  Patient given fluids.  No signs of abscess, will get ultrasound of his armpit but no obvious abscess there.  Does not cover a joint to suggest septic arthritis.  Low suspicion for necrotizing fasciitis.  Ultrasound with evidence of lymph node.  Discussed with patient he is willing to stay in the hospital.  Will start on IV ceftriaxone.  Will discuss possible team for admission       ____________________________________________   FINAL CLINICAL IMPRESSION(S) / ED DIAGNOSES   Final diagnoses:  Right arm pain  Cellulitis of right upper extremity  Sepsis, due to unspecified organism, unspecified whether acute organ dysfunction present ([place])       MEDICATIONS GIVEN DURING THIS VISIT:  Medications  cefTRIAXone (ROCEPHIN) 2 g in sodium chloride 0.9 % 100 mL IVPB (has no administration in time range)  sodium chloride 0.9 % bolus 1,000 mL (has no administration in time range)     ED Discharge Orders    None       Note:  This document was prepared using Dragon voice recognition software and may include unintentional dictation errors.   Vanessa Wilton, MD 12/28/19 1100

## 2019-12-28 NOTE — ED Notes (Signed)
This RN to bedside to initiate IV access and obtain blood cultures. Pt refusing to get off cell phone regarding Suboxone and the hospital not having any, pt can be heard on phone asking for someone to bring his prescription to him. [place] to bedside to take patient to [place]. Explained will place patient on cardiac monitor and obtain blood cultures when patient returns. Pt also states that his "boss better bring him his money". Pt visualized in NAD at this time.

## 2019-12-28 NOTE — ED Notes (Signed)
This RN to bedside to re-evaluate patient, pt noted to be asleep while sitting up in bed at this time.

## 2019-12-28 NOTE — ED Notes (Addendum)
Admitting MD requesting to know what medication patient is wanting that he is concerned may not be in formulary, upon this RN arrival to bedside pt states, "why is my IV not hooked up to anything?" This RN explained that the IV was the piece in his arm, and that patient had completed fluid bolus and no further IV medications were ordered. Pt states after asking several times that he takes methadone and is prescribed methadone through the [facility]. Admitting MD made aware at this time. Admitting MD made aware via secure chat, awaiting orders. Admitting MD also aware that patient requesting to see her. Awaiting orders.

## 2019-12-28 NOTE — ED Notes (Signed)
This RN to bedside to check on patient, pt noted to be sitting up on side of bed at this time stating, "No i'm getting ready to leave". Pt states anger regarding "nobody has checked on me". This RN explained last time this RN checked on patient he was asleep. Pt states, "I'm sorry when I get like this I get like a little bitch, please don't be mad at me". Pain medication administered per order. Pt given something to eat and drink at this time. Pt repositioned in bed at this time.

## 2019-12-29 ENCOUNTER — Encounter (HOSPITAL_COMMUNITY): Payer: Self-pay | Admitting: Emergency Medicine

## 2019-12-29 ENCOUNTER — Emergency Department (HOSPITAL_COMMUNITY)
Admission: EM | Admit: 2019-12-29 | Discharge: 2019-12-29 | Disposition: A | Discharge status: Home / Self Care | Payer: Self-pay | Attending: Emergency Medicine | Admitting: Emergency Medicine

## 2019-12-29 DIAGNOSIS — L03113 Cellulitis of right upper limb: Secondary | ICD-10-CM | POA: Insufficient documentation

## 2019-12-29 MED ORDER — MORPHINE SULFATE (PF) 4 MG/ML IV SOLN
4.0000 mg | Freq: Once | INTRAVENOUS | Status: AC
  Administered 2019-12-29: 4 mg via INTRAVENOUS
  Filled 2019-12-29: qty 1

## 2019-12-29 MED ORDER — SODIUM CHLORIDE 0.9 % IV SOLN
2.0000 g | Freq: Once | INTRAVENOUS | Status: AC
  Administered 2019-12-29: 2 g via INTRAVENOUS
  Filled 2019-12-29: qty 20

## 2019-12-29 MED ORDER — CEPHALEXIN 500 MG PO CAPS
500.0000 mg | ORAL_CAPSULE | Freq: Three times a day (TID) | ORAL | 0 refills | Status: DC
Start: 2019-12-29 — End: 2022-01-22

## 2019-12-29 MED ORDER — KETOROLAC TROMETHAMINE 15 MG/ML IJ SOLN
15.0000 mg | Freq: Once | INTRAMUSCULAR | Status: AC
  Administered 2019-12-29: 15 mg via INTRAVENOUS
  Filled 2019-12-29: qty 1

## 2019-12-29 NOTE — Discharge Summary (Addendum)
Physician Discharge Summary  [name] KZL:[id number] DOB: October 31, 1975 DOA: 12/28/2019  PCP: Patient, No Pcp Per  Admit date: 12/28/2019 Discharge date: 12/28/19  Admitted From: Home Disposition:  Eloped from ER  Recommendations for Outpatient Follow-up:  1. Follow up with PCP in 1-2 weeks 2. Please obtain BMP/CBC in one week 3. Please follow up on the following pending results:  Home Health: (NO) Equipment/Devices:   Discharge Condition:  Left AMA  CODE STATUS: FULL Diet recommendation: Regular   Brief/Interim Summary:   Kenneth Day is a 44 y.o. male with no significant medical history who presents to the emergency room for evaluation of areas of skin break,  redness and swelling involving his right hand.  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  He developed fever and and a knot in his right axilla for which he presented to the emergency room on 12/27/19 but left without being seen.  He had a temp of 103F when he came to the ER on  12/27/19. On the day of admission he was tachycardic but  afebrile. Labs reveal a white count of 14,000 He denied having any cough, no nausea, no vomiting, no abdominal pain, no changes in his bowel habits, no urinary symptoms, no dizziness or lightheadedness. He was admitted to the hospital and started on IV Ancef for right hand cellulitis.  Patient eloped from the emergency room which his IV in place.    Discharge Diagnoses:  Principal Problem:   Cellulitis of hand, right Active Problems:   Cellulitis    Discharge Instructions   Allergies as of 12/28/2019   No Known Allergies     Medication List    ASK your doctor about these medications   cyclobenzaprine 10 MG tablet Commonly known as: FLEXERIL Take 1 tablet (10 mg total) by mouth 2 (two) times daily as needed for muscle spasms.   methadone 10 MG/ML solution Commonly known as: DOLOPHINE Take 140 mg by mouth  daily.   methylPREDNISolone 4 MG tablet Commonly known as: MEDROL DOSEPAK Take as directed   sulfamethoxazole-trimethoprim 800-160 MG tablet Commonly known as: BACTRIM DS Take 1 tablet by mouth 2 (two) times daily.   traMADol 50 MG tablet Commonly known as: ULTRAM Take 1 tablet (50 mg total) by mouth every 6 (six) hours as needed for pain.       No Known Allergies  Consultations:  Specify Physician/Group   Procedures/Studies: [place] AXILLA RIGHT  Result Date: 12/28/2019 CLINICAL DATA:  RIGHT axillary pain question abscess; patient with RIGHT hand infection/cellulitis EXAM: ULTRASOUND OF THE RIGHT AXILLA COMPARISON:  None FINDINGS: Sonography performed at site of symptoms at the RIGHT axilla. Small normal sized axillary lymph node with normal morphology noted at RIGHT axilla. In addition, a minimally enlarged RIGHT axillary lymph node is identified measuring 23 x 12 x 15 mm, corresponding to site of patient's pain. This node demonstrates abnormal morphology, lacking a fatty hilum, with diffuse cortical thickening and mild lobulation noted. This could be due to a reactive node in the setting of an acute inflammatory process but can be seen with other etiologies including lymphoma and metastatic disease. IMPRESSION: Mildly enlarged abnormal lymph node 23 x 12 x 15 mm at site of symptoms in RIGHT axilla. In the setting of acute infection in the RIGHT upper extremity, this most likely represents an enlarged reactive lymph node. This requires clinical follow-up until resolution to exclude other etiologies,  since lymphoma and metastatic disease can cause abnormal appearing lymph nodes with a similar appearance. Also, recommend correlation with breast exam to exclude breast etiology. Electronically Signed   By: Ulyses Southward M.D.   On: 12/28/2019 10:44    (Echo, Carotid, EGD, Colonoscopy, ERCP)    Subjective:   Discharge Exam: Vitals:   12/28/19 1600 12/28/19 1630  BP: 127/75 (!) 141/83   Pulse: (!) 108 (!) 105  Resp:    Temp:    SpO2: 98% 100%   Vitals:   12/28/19 1500 12/28/19 1530 12/28/19 1600 12/28/19 1630  BP: 132/77 132/85 127/75 (!) 141/83  Pulse: 99 (!) 103 (!) 108 (!) 105  Resp:      Temp:      TempSrc:      SpO2: 93% 94% 98% 100%  Weight:      Height:        General: Pt is alert, awake, not in acute distress Cardiovascular: RRR, S1/S2 +, no rubs, no gallops Respiratory: CTA bilaterally, no wheezing, no rhonchi Abdominal: Soft, NT, ND, bowel sounds + Extremities: Redness over right hand with area of skin breaks    The results of significant diagnostics from this hospitalization (including imaging, microbiology, ancillary and laboratory) are listed below for reference.     Microbiology: Recent Results (from the past 240 hour(s))  Blood culture (routine x 2)     Status: None (Preliminary result)   Collection Time: 12/27/19  6:05 PM   Specimen: BLOOD  Result Value Ref Range Status   Specimen Description BLOOD LEFT ANTECUBITAL  Final   Special Requests   Final    BOTTLES DRAWN AEROBIC AND ANAEROBIC Blood Culture adequate volume   Culture   Final    NO GROWTH 2 DAYS Performed at [facility], [address], [place], [place] [address]    Report Status PENDING  Incomplete  Blood culture (routine x 2)     Status: None (Preliminary result)   Collection Time: 12/28/19  9:56 AM   Specimen: BLOOD  Result Value Ref Range Status   Specimen Description BLOOD LEFT AC  Final   Special Requests   Final    BOTTLES DRAWN AEROBIC AND ANAEROBIC Blood Culture adequate volume   Culture   Final    NO GROWTH < 24 HOURS Performed at [facility], [address], [place], [place] [address]    Report Status PENDING  Incomplete  Blood culture (routine x 2)     Status: None (Preliminary result)   Collection Time: 12/28/19  9:56 AM   Specimen: BLOOD  Result Value Ref Range Status   Specimen Description BLOOD LEFT HAND  Final   Special  Requests   Final    BOTTLES DRAWN AEROBIC AND ANAEROBIC Blood Culture results may not be optimal due to an inadequate volume of blood received in culture bottles   Culture   Final    NO GROWTH < 24 HOURS Performed at [facility], [address], [place], [place] [address]    Report Status PENDING  Incomplete  SARS Coronavirus 2 by RT PCR (hospital order, performed in [facility] hospital lab) Nasopharyngeal Nasopharyngeal Swab     Status: None   Collection Time: 12/28/19 11:38 AM   Specimen: Nasopharyngeal Swab  Result Value Ref Range Status   SARS Coronavirus 2 NEGATIVE NEGATIVE Final    Comment: (NOTE) SARS-CoV-2 target nucleic acids are NOT DETECTED. The SARS-CoV-2 RNA is generally detectable in upper and lower respiratory specimens during  the acute phase of infection. The lowest concentration of SARS-CoV-2 viral copies this assay can detect is 250 copies / mL. A negative result does not preclude SARS-CoV-2 infection and should not be used as the sole basis for treatment or other patient management decisions.  A negative result may occur with improper specimen collection / handling, submission of specimen other than nasopharyngeal swab, presence of viral mutation(s) within the areas targeted by this assay, and inadequate number of viral copies (<250 copies / mL). A negative result must be combined with clinical observations, patient history, and epidemiological information. Fact Sheet for Patients:   StrictlyIdeas.no Fact Sheet for Healthcare Providers: BankingDealers.co.za This test is not yet approved or cleared  by the [place] FDA and has been authorized for detection and/or diagnosis of SARS-CoV-2 by FDA under an Emergency Use Authorization (EUA).  This EUA will remain in effect (meaning this test can be used) for the duration of the COVID-19 declaration under Section 564(b)(1) of the Act, 21 U.S.C. section  360bbb-3(b)(1), unless the authorization is terminated or revoked sooner. Performed at [facility], [address] [place], [place] [address]      Labs: BNP (last 3 results) No results for input(s): BNP in the last 8760 hours. Basic Metabolic Panel: Recent Labs  Lab 12/27/19 1805 12/28/19 0723  NA 131* 131*  K 3.8 3.5  CL 99 98  CO2 23 24  GLUCOSE 149* 115*  BUN 10 10  CREATININE 0.79 0.72  CALCIUM 8.5* 8.6*   Liver Function Tests: Recent Labs  Lab 12/28/19 0723  AST 24  ALT 35  ALKPHOS 123  BILITOT 0.6  PROT 7.6  ALBUMIN 3.7   No results for input(s): LIPASE, AMYLASE in the last 168 hours. No results for input(s): AMMONIA in the last 168 hours. CBC: Recent Labs  Lab 12/27/19 1805 12/28/19 0723  WBC 11.2* 14.5*  NEUTROABS  --  13.2*  HGB 11.3* 11.6*  HCT 34.6* 34.8*  MCV 87.4 85.7  PLT 145* 140*   Cardiac Enzymes: No results for input(s): CKTOTAL, CKMB, CKMBINDEX, TROPONINI in the last 168 hours. BNP: Invalid input(s): POCBNP CBG: No results for input(s): GLUCAP in the last 168 hours. D-Dimer No results for input(s): DDIMER in the last 72 hours. Hgb A1c No results for input(s): HGBA1C in the last 72 hours. Lipid Profile No results for input(s): CHOL, HDL, LDLCALC, TRIG, CHOLHDL, LDLDIRECT in the last 72 hours. Thyroid function studies No results for input(s): TSH, T4TOTAL, T3FREE, THYROIDAB in the last 72 hours.  Invalid input(s): FREET3 Anemia work up No results for input(s): VITAMINB12, FOLATE, FERRITIN, TIBC, IRON, RETICCTPCT in the last 72 hours. Urinalysis    Component Value Date/Time   COLORURINE YELLOW (A) 12/28/2019 0720   APPEARANCEUR HAZY (A) 12/28/2019 0720   LABSPEC 1.010 12/28/2019 0720   PHURINE 6.0 12/28/2019 0720   GLUCOSEU NEGATIVE 12/28/2019 0720   HGBUR NEGATIVE 12/28/2019 0720   BILIRUBINUR NEGATIVE 12/28/2019 0720   KETONESUR NEGATIVE 12/28/2019 0720   PROTEINUR NEGATIVE 12/28/2019 0720   NITRITE NEGATIVE  12/28/2019 0720   LEUKOCYTESUR SMALL (A) 12/28/2019 0720   Sepsis Labs Invalid input(s): PROCALCITONIN,  WBC,  LACTICIDVEN Microbiology Recent Results (from the past 240 hour(s))  Blood culture (routine x 2)     Status: None (Preliminary result)   Collection Time: 12/27/19  6:05 PM   Specimen: BLOOD  Result Value Ref Range Status   Specimen Description BLOOD LEFT ANTECUBITAL  Final   Special Requests   Final  BOTTLES DRAWN AEROBIC AND ANAEROBIC Blood Culture adequate volume   Culture   Final    NO GROWTH 2 DAYS Performed at [facility], [address] [place] [address] [place], [place] [address]    Report Status PENDING  Incomplete  Blood culture (routine x 2)     Status: None (Preliminary result)   Collection Time: 12/28/19  9:56 AM   Specimen: BLOOD  Result Value Ref Range Status   Specimen Description BLOOD LEFT AC  Final   Special Requests   Final    BOTTLES DRAWN AEROBIC AND ANAEROBIC Blood Culture adequate volume   Culture   Final    NO GROWTH < 24 HOURS Performed at [facility], [address] [place], [place] [address]    Report Status PENDING  Incomplete  Blood culture (routine x 2)     Status: None (Preliminary result)   Collection Time: 12/28/19  9:56 AM   Specimen: BLOOD  Result Value Ref Range Status   Specimen Description BLOOD LEFT HAND  Final   Special Requests   Final    BOTTLES DRAWN AEROBIC AND ANAEROBIC Blood Culture results may not be optimal due to an inadequate volume of blood received in culture bottles   Culture   Final    NO GROWTH < 24 HOURS Performed at [facility], [address], [place], [place] [address]    Report Status PENDING  Incomplete  SARS Coronavirus 2 by RT PCR (hospital order, performed in [facility] hospital lab) Nasopharyngeal Nasopharyngeal Swab     Status: None   Collection Time: 12/28/19 11:38 AM   Specimen: Nasopharyngeal Swab  Result Value Ref Range Status   SARS Coronavirus 2 NEGATIVE NEGATIVE Final     Comment: (NOTE) SARS-CoV-2 target nucleic acids are NOT DETECTED. The SARS-CoV-2 RNA is generally detectable in upper and lower respiratory specimens during the acute phase of infection. The lowest concentration of SARS-CoV-2 viral copies this assay can detect is 250 copies / mL. A negative result does not preclude SARS-CoV-2 infection and should not be used as the sole basis for treatment or other patient management decisions.  A negative result may occur with improper specimen collection / handling, submission of specimen other than nasopharyngeal swab, presence of viral mutation(s) within the areas targeted by this assay, and inadequate number of viral copies (<250 copies / mL). A negative result must be combined with clinical observations, patient history, and epidemiological information. Fact Sheet for Patients:   BoilerBrush.com.cy Fact Sheet for Healthcare Providers: https://pope.com/ This test is not yet approved or cleared  by the [place] FDA and has been authorized for detection and/or diagnosis of SARS-CoV-2 by FDA under an Emergency Use Authorization (EUA).  This EUA will remain in effect (meaning this test can be used) for the duration of the COVID-19 declaration under Section 564(b)(1) of the Act, 21 U.S.C. section 360bbb-3(b)(1), unless the authorization is terminated or revoked sooner. Performed at [facility], [address] [place], [place] [address]      Time coordinating discharge: Over 30 minutes  SIGNED:   Lucile Shutters, MD  [facility] 12/29/2019, 7:36 AM Pager   If 7PM-7AM, please contact night-coverage [email or web address] Password [email or web address]

## 2019-12-29 NOTE — ED Triage Notes (Signed)
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 Now has a swollen lymph node under his R arm. Went to [facility] on 5/26 and had some fluids, states he left AMA due to waititng so long. Here today for re evaluation of R hand. Reports fever a few days ago but has not had one yesterday or today.

## 2020-01-01 LAB — CULTURE, BLOOD (ROUTINE X 2)
Culture: NO GROWTH
Special Requests: ADEQUATE

## 2020-01-02 LAB — CULTURE, BLOOD (ROUTINE X 2)
Culture: NO GROWTH
Culture: NO GROWTH
Special Requests: ADEQUATE

## 2020-01-04 NOTE — ED Provider Notes (Signed)
[facility] EMERGENCY DEPARTMENT Provider Note   CSN: [id number] Arrival date & time: 12/29/19  4098     History Chief Complaint  Patient presents with  . Rash    Kenneth Day is a 44 y.o. male.  HPI   44 year old male with pain and redness in his right hand.  Onset several days ago.  He does admit to picking at skin lesions there.  He initially went to [facility] seated dose of IV antibiotics but actually left prior to be seen by the provider.  He did not get a prescription for antibiotics.  That is why his presenting today.  Denies any fevers.  History reviewed. No pertinent past medical history.  Patient Active Problem List   Diagnosis Date Noted  . Cellulitis and abscess of hand 12/28/2019  . Cellulitis 12/28/2019  . Cellulitis of hand, right 12/28/2019    No past surgical history on file.     No family history on file.  Social History   Tobacco Use  . Smoking status: Never Smoker  . Smokeless tobacco: Current User  Substance Use Topics  . Alcohol use: Never  . Drug use: Not Currently    Comment: methadone clinic    Home Medications Prior to Admission medications   Medication Sig Start Date End Date Taking? Authorizing Provider  methadone (DOLOPHINE) 10 MG/ML solution Take 30 mg by mouth daily.    Yes [provider]  cephALEXin (KEFLEX) 500 MG capsule Take 1 capsule (500 mg total) by mouth 3 (three) times daily. 12/29/19   Raeford Razor, MD  cyclobenzaprine (FLEXERIL) 10 MG tablet Take 1 tablet (10 mg total) by mouth 2 (two) times daily as needed for muscle spasms. Patient not taking: Reported on 12/28/2019 05/20/12   Johnsie Kindred, NP  methylPREDNISolone (MEDROL DOSEPAK) 4 MG tablet Take as directed Patient not taking: Reported on 12/28/2019 05/20/12   Johnsie Kindred, NP  sulfamethoxazole-trimethoprim (BACTRIM DS,SEPTRA DS) 800-160 MG tablet Take 1 tablet by mouth 2 (two) times daily. Patient not taking:  Reported on 12/28/2019 04/06/18   Menshew, Charlesetta Ivory, PA-C  traMADol (ULTRAM) 50 MG tablet Take 1 tablet (50 mg total) by mouth every 6 (six) hours as needed for pain. Patient not taking: Reported on 12/28/2019 05/20/12   Johnsie Kindred, NP    Allergies    Patient has no known allergies.  Review of Systems   Review of Systems All systems reviewed and negative, other than as noted in HPI.  Physical Exam Updated Vital Signs BP 107/88   Pulse 92   Temp 98.3 F (36.8 C) (Oral)   Resp 20   SpO2 98%   Physical Exam Vitals and nursing note reviewed.  Constitutional:      General: He is not in acute distress.    Appearance: He is well-developed.  HENT:     Head: Normocephalic and atraumatic.  Eyes:     General:        Right eye: No discharge.        Left eye: No discharge.     Conjunctiva/sclera: Conjunctivae normal.  Cardiovascular:     Rate and Rhythm: Normal rate and regular rhythm.     Heart sounds: Normal heart sounds. No murmur. No friction rub. No gallop.   Pulmonary:     Effort: Pulmonary effort is normal. No respiratory distress.     Breath sounds: Normal breath sounds.  Abdominal:     General: There is no  distension.     Palpations: Abdomen is soft.     Tenderness: There is no abdominal tenderness.  Musculoskeletal:        General: No tenderness.     Cervical back: Neck supple.     Comments: Numerous lesions in various stages of healing and excoriations of the dorsum of the right hand.  There does appear to be a superimposed cellulitis.  No abscess.  No significant swelling.  Skin:    General: Skin is warm and dry.  Neurological:     Mental Status: He is alert.  Psychiatric:        Behavior: Behavior normal.        Thought Content: Thought content normal.     ED Results / Procedures / Treatments   Labs (all labs ordered are listed, but only abnormal results are displayed) Labs Reviewed - No data to display  EKG None  Radiology No results  found.  Procedures Procedures (including critical care time)  Medications Ordered in ED Medications  morphine 4 MG/ML injection 4 mg (4 mg Intravenous Given 12/29/19 1228)  ketorolac (TORADOL) 15 MG/ML injection 15 mg (15 mg Intravenous Given 12/29/19 1226)  cefTRIAXone (ROCEPHIN) 2 g in sodium chloride 0.9 % 100 mL IVPB (0 g Intravenous Stopped 12/29/19 1305)    ED Course  I have reviewed the triage vital signs and the nursing notes.  Pertinent labs & imaging results that were available during my care of the patient were reviewed by me and considered in my medical decision making (see chart for details).    MDM Rules/Calculators/A&P                      44 year old male with signs consistent with a cellulitis of the right hand.  Advised he needs to stop irritating this area the best he can.  Will place on antibiotics.  Continue wound care and return precautions were discussed.  Outpatient follow-up otherwise.  Final Clinical Impression(s) / ED Diagnoses Final diagnoses:  Cellulitis of right hand    Rx / DC Orders ED Discharge Orders         Ordered    cephALEXin (KEFLEX) 500 MG capsule  3 times daily     12/29/19 1308           Virgel Manifold, MD 01/04/20 1018

## 2021-03-31 ENCOUNTER — Other Ambulatory Visit: Payer: Self-pay

## 2021-03-31 ENCOUNTER — Emergency Department
Admission: EM | Admit: 2021-03-31 | Discharge: 2021-03-31 | Disposition: A | Discharge status: Home / Self Care | Payer: Self-pay | Attending: Emergency Medicine | Admitting: Emergency Medicine

## 2021-03-31 ENCOUNTER — Emergency Department: Payer: Self-pay

## 2021-03-31 DIAGNOSIS — R110 Nausea: Secondary | ICD-10-CM | POA: Insufficient documentation

## 2021-03-31 DIAGNOSIS — R079 Chest pain, unspecified: Secondary | ICD-10-CM | POA: Insufficient documentation

## 2021-03-31 DIAGNOSIS — R0602 Shortness of breath: Secondary | ICD-10-CM | POA: Insufficient documentation

## 2021-03-31 DIAGNOSIS — Z5321 Procedure and treatment not carried out due to patient leaving prior to being seen by health care provider: Secondary | ICD-10-CM | POA: Insufficient documentation

## 2021-03-31 LAB — BASIC METABOLIC PANEL
Anion gap: 5 (ref 5–15)
BUN: 10 mg/dL (ref 6–20)
CO2: 29 mmol/L (ref 22–32)
Calcium: 9.1 mg/dL (ref 8.9–10.3)
Chloride: 105 mmol/L (ref 98–111)
Creatinine, Ser: 0.87 mg/dL (ref 0.61–1.24)
GFR, Estimated: >60 mL/min (ref >60)
Glucose, Bld: 100 mg/dL — ABNORMAL HIGH (ref 70–99)
Potassium: 4.2 mmol/L (ref 3.5–5.1)
Sodium: 139 mmol/L (ref 135–145)

## 2021-03-31 LAB — CBC
HCT: 39.7 % (ref 39.0–52.0)
Hemoglobin: 13.7 g/dL (ref 13.0–17.0)
MCH: 30.7 pg (ref 26.0–34.0)
MCHC: 34.5 g/dL (ref 30.0–36.0)
MCV: 89 fL (ref 80.0–100.0)
Platelets: 240 10*3/uL (ref 150–400)
RBC: 4.46 MIL/uL (ref 4.22–5.81)
RDW: 12.9 % (ref 11.5–15.5)
WBC: 6.4 10*3/uL (ref 4.0–10.5)
nRBC: 0 % (ref 0.0–0.2)

## 2021-03-31 LAB — TROPONIN I (HIGH SENSITIVITY): Troponin I (High Sensitivity): 3 ng/L (ref <18)

## 2021-03-31 NOTE — ED Triage Notes (Signed)
Pt to ED for worsening cp today, states has had cp for the past few days.  +nausea, shob.  States 2 falls today when legs gave out. Denies LOC, unsure of hitting head.  Alert and oriented.

## 2022-01-21 ENCOUNTER — Emergency Department: Payer: Self-pay

## 2022-01-21 ENCOUNTER — Other Ambulatory Visit: Payer: Self-pay

## 2022-01-21 ENCOUNTER — Encounter: Payer: Self-pay | Admitting: Emergency Medicine

## 2022-01-21 ENCOUNTER — Emergency Department
Admission: EM | Admit: 2022-01-21 | Discharge: 2022-01-21 | Discharge status: Against Medical Advice | Payer: Self-pay | Attending: Emergency Medicine | Admitting: Emergency Medicine

## 2022-01-21 DIAGNOSIS — I809 Phlebitis and thrombophlebitis of unspecified site: Secondary | ICD-10-CM

## 2022-01-21 DIAGNOSIS — G08 Intracranial and intraspinal phlebitis and thrombophlebitis: Secondary | ICD-10-CM | POA: Insufficient documentation

## 2022-01-21 DIAGNOSIS — N453 Epididymo-orchitis: Secondary | ICD-10-CM | POA: Insufficient documentation

## 2022-01-21 LAB — COMPREHENSIVE METABOLIC PANEL
ALT: 15 U/L (ref 0–44)
AST: 14 U/L — ABNORMAL LOW (ref 15–41)
Albumin: 3.5 g/dL (ref 3.5–5.0)
Alkaline Phosphatase: 97 U/L (ref 38–126)
Anion gap: 6 (ref 5–15)
BUN: 10 mg/dL (ref 6–20)
CO2: 27 mmol/L (ref 22–32)
Calcium: 8.9 mg/dL (ref 8.9–10.3)
Chloride: 103 mmol/L (ref 98–111)
Creatinine, Ser: 0.71 mg/dL (ref 0.61–1.24)
GFR, Estimated: >60 mL/min (ref >60)
Glucose, Bld: 156 mg/dL — ABNORMAL HIGH (ref 70–99)
Potassium: 3.9 mmol/L (ref 3.5–5.1)
Sodium: 136 mmol/L (ref 135–145)
Total Bilirubin: 0.3 mg/dL (ref 0.3–1.2)
Total Protein: 7.4 g/dL (ref 6.5–8.1)

## 2022-01-21 LAB — CBC
HCT: 35.1 % — ABNORMAL LOW (ref 39.0–52.0)
Hemoglobin: 11.3 g/dL — ABNORMAL LOW (ref 13.0–17.0)
MCH: 26.9 pg (ref 26.0–34.0)
MCHC: 32.2 g/dL (ref 30.0–36.0)
MCV: 83.6 fL (ref 80.0–100.0)
Platelets: 216 10*3/uL (ref 150–400)
RBC: 4.2 MIL/uL — ABNORMAL LOW (ref 4.22–5.81)
RDW: 13.9 % (ref 11.5–15.5)
WBC: 15 10*3/uL — ABNORMAL HIGH (ref 4.0–10.5)
nRBC: 0 % (ref 0.0–0.2)

## 2022-01-21 LAB — URINALYSIS, ROUTINE W REFLEX MICROSCOPIC
Bilirubin Urine: NEGATIVE
Glucose, UA: NEGATIVE mg/dL
Ketones, ur: NEGATIVE mg/dL
Nitrite: NEGATIVE
Protein, ur: 100 mg/dL — AB
RBC / HPF: >50 RBC/hpf — ABNORMAL HIGH (ref 0–5)
Specific Gravity, Urine: 1.024 (ref 1.005–1.030)
WBC, UA: >50 WBC/hpf — ABNORMAL HIGH (ref 0–5)
pH: 5 (ref 5.0–8.0)

## 2022-01-21 LAB — CHLAMYDIA/NGC RT PCR (ARMC ONLY)
Chlamydia Tr: NOT DETECTED
N gonorrhoeae: NOT DETECTED

## 2022-01-21 LAB — LIPASE, BLOOD: Lipase: 23 U/L (ref 11–51)

## 2022-01-21 MED ORDER — OXYCODONE-ACETAMINOPHEN 5-325 MG PO TABS
1.0000 | ORAL_TABLET | ORAL | Status: DC | PRN
  Administered 2022-01-21: 1 via ORAL
  Filled 2022-01-21 (×2): qty 1

## 2022-01-21 MED ORDER — OXYCODONE-ACETAMINOPHEN 5-325 MG PO TABS
1.0000 | ORAL_TABLET | Freq: Once | ORAL | Status: AC
  Administered 2022-01-21: 1 via ORAL
  Filled 2022-01-21: qty 1

## 2022-01-21 NOTE — ED Triage Notes (Signed)
Pt via POV from home. Pt c/o hematuria when he pees, he feels like he is not emptying and then after that he just get bright blood. States he has pain in his groin and testicle for the past 4 days. States he has swelling in R testicle that he noticed this AM. Pt is A&Ox4 but seems very uncomfortable.

## 2022-01-21 NOTE — ED Provider Notes (Cosign Needed)
[facility] Provider Note  Patient Contact: 4:34 PM (approximate)   History   Hematuria   HPI  Kenneth Day is a 46 y.o. male who presents to the emergency department complaining of right-sided testicular pain, dysuria, hematuria.  Patient states 3 to 4 days ago he woke up, felt like he had to urinate.  He states that he felt like he did not urinate as much as the symptoms led him to believe.  He states that he noticed that the urine was dark but it was the middle of the night he not turn the light on.  Patient states that he is having right-sided testicular pain that radiates up into his right lower quadrant.  He has no fevers or chills.  He states that he will urinate, then typically have a small amount of blood at the end of his urination.  He is noticing no discharge.  Patient with no history of nephrolithiasis.  No flank pain.  He states that he has no right lower quadrant pain until his testicle hurts in which case the pain shoots from his testicle up into his abdomen.  He states that for the last year he has been monogamous with one individual but has had unprotected sex.  No history of STD/STIs.     Physical Exam   Triage Vital Signs: ED Triage Vitals  Enc Vitals Group     BP 01/21/22 1429 (!) 157/93     Pulse Rate 01/21/22 1429 (!) 110     Resp 01/21/22 1429 18     Temp 01/21/22 1429 98.4 F (36.9 C)     Temp Source 01/21/22 1429 Oral     SpO2 01/21/22 1429 95 %     Weight 01/21/22 1430 185 lb (83.9 kg)     Height 01/21/22 1430 6' (1.829 m)     Head Circumference --      Peak Flow --      Pain Score 01/21/22 1430 10     Pain Loc --      Pain Edu? --      Excl. in GC? --     Most recent vital signs: Vitals:   01/21/22 1822 01/21/22 1830  BP: (!) 147/80 139/80  Pulse: (!) 109 (!) 116  Resp: 18 18  Temp:    SpO2: 96% 96%     General: Alert and in no acute distress.   Cardiovascular:  Good peripheral perfusion Respiratory: Normal  respiratory effort without tachypnea or retractions. Lungs CTAB. Good air entry to the bases with no decreased or absent breath sounds Gastrointestinal: Bowel sounds 4 quadrants. Soft and nontender to palpation in all quadrants.  Patient has no tenderness in the right quadrant, though he has shooting pains in which case he grabs his right lower quadrant originating in his right testicle.. No guarding or rigidity. No palpable masses. No distention. No CVA tenderness. Musculoskeletal: Full range of motion to all extremities.  Neurologic:  No gross focal neurologic deficits are appreciated.  Skin:   No rash noted Other:   ED Results / Procedures / Treatments   Labs (all labs ordered are listed, but only abnormal results are displayed) Labs Reviewed  COMPREHENSIVE METABOLIC PANEL - Abnormal; Notable for the following components:      Result Value   Glucose, Bld 156 (*)    AST 14 (*)    All other components within normal limits  CBC - Abnormal; Notable for the following components:   WBC  15.0 (*)    RBC 4.20 (*)    Hemoglobin 11.3 (*)    HCT 35.1 (*)    All other components within normal limits  URINALYSIS, ROUTINE W REFLEX MICROSCOPIC - Abnormal; Notable for the following components:   Color, Urine YELLOW (*)    APPearance CLOUDY (*)    Hgb urine dipstick LARGE (*)    Protein, ur 100 (*)    Leukocytes,Ua MODERATE (*)    RBC / HPF >50 (*)    WBC, UA >50 (*)    Bacteria, UA MANY (*)    Non Squamous Epithelial PRESENT (*)    All other components within normal limits  CHLAMYDIA/NGC RT PCR ([facility] ONLY)            LIPASE, BLOOD     EKG     RADIOLOGY  I personally viewed, evaluated, and interpreted these images as part of my medical decision making, as well as reviewing the written report by the radiologist.  ED Provider Interpretation: Findings consistent with epididymal orchitis bilaterally to testicles on ultrasound.  No evidence of torsion.  Patient had CT scan which  revealed stranding about the gonadal veins concerning for septic thrombophlebitis.  No other acute intra-abdominal findings on CT such appendicitis.  Patient does have a punctate stone that is nonobstructing in the right kidney.  CT Renal Stone Study  Result Date: 01/21/2022 CLINICAL DATA:  Right testicular pain, hematuria for 4 days EXAM: CT ABDOMEN AND PELVIS WITHOUT CONTRAST TECHNIQUE: Multidetector CT imaging of the abdomen and pelvis was performed following the standard protocol without IV contrast. RADIATION DOSE REDUCTION: This exam was performed according to the departmental dose-optimization program which includes automated exposure control, adjustment of the mA and/or kV according to patient size and/or use of iterative reconstruction technique. COMPARISON:  None Available. FINDINGS: Lower chest: The lung bases are clear. The imaged heart is unremarkable. Hepatobiliary: The liver and gallbladder are unremarkable. There is no biliary ductal dilatation. Pancreas: Unremarkable. Spleen: Unremarkable. Adrenals/Urinary Tract: The adrenals are unremarkable. There is a punctate nonobstructing right lower pole renal stone. No other stones are seen in either kidney or along the course of either ureter. There is no focal parenchymal lesions, within the confines of noncontrast technique. There is no hydronephrosis or hydroureter. There is stranding in the fat along the course of the right gonadal vein which appears separate from the ureter (2-57). There is no organized or drainable fluid collection. The bladder is unremarkable. Stomach/Bowel: The stomach is unremarkable. There is no evidence of bowel obstruction. There is no abnormal bowel wall thickening or inflammatory change. The appendix is not definitively identified. Vascular/Lymphatic: There is minimal calcified plaque in the nonaneurysmal abdominal aorta. There is no abdominal or pelvic lymphadenopathy. Reproductive: The prostate and seminal vesicles are  unremarkable. Small bilateral hydroceles are noted. Other: There is no ascites or free air. Musculoskeletal: There is no acute osseous abnormality or suspicious osseous lesion. There is degenerative change at L5-S1. IMPRESSION: 1. Stranding in the right hemiabdomen centered around the right gonadal veins which is suggestive of septic thrombophlebitis. 2. Punctate nonobstructing right renal stone. No obstructing stone or hydroureteronephrosis. Electronically Signed   By: Valetta Mole M.D.   On: 01/21/2022 18:36   US SCROTUM W/DOPPLER  Result Date: 01/21/2022 CLINICAL DATA:  Testicular pain for 4 days.  Hematuria. EXAM: SCROTAL ULTRASOUND DOPPLER ULTRASOUND OF THE TESTICLES TECHNIQUE: Complete ultrasound examination of the testicles, epididymis, and other scrotal structures was performed. Color and spectral Doppler ultrasound were also  utilized to evaluate blood flow to the testicles. COMPARISON:  None Available. FINDINGS: Right testicle Measurements: 4.6 x 3.0 x 3.4 cm. No mass or microlithiasis visualized. Increased vascularity. Left testicle Measurements: 4.5 x 2.6 x 2.6 cm. No mass or microlithiasis visualized. Increased vascularity Right epididymis:  Increased vascularity. Left epididymis:  Increased vascularity. Hydrocele:  Trace bilateral reactive hydroceles. Varicocele:  None visualized. Pulsed Doppler interrogation of both testes demonstrates normal low resistance arterial and venous waveforms bilaterally. IMPRESSION: 1. Increased vascularity of the bilateral testicles and epididymides, consistent with bilateral epididymo-orchitis. Electronically Signed   By: Obie Dredge M.D.   On: 01/21/2022 17:53    PROCEDURES:  Critical Care performed: No  Procedures   MEDICATIONS ORDERED IN ED: Medications  oxyCODONE-acetaminophen (PERCOCET/ROXICET) 5-325 MG per tablet 1 tablet (1 tablet Oral Given 01/21/22 1557)  oxyCODONE-acetaminophen (PERCOCET/ROXICET) 5-325 MG per tablet 1 tablet (1 tablet Oral  Given 01/21/22 1907)     IMPRESSION / MDM / ASSESSMENT AND PLAN / ED COURSE  I reviewed the triage vital signs and the nursing notes.                              Differential diagnosis includes, but is not limited to, STD, epididymitis, testicular torsion, nephrolithiasis, UTI, pyelonephritis, appendicitis, colitis  Patient's presentation is most consistent with acute illness / injury with system symptoms.   Patient's diagnosis is consistent with epididymitis, septic thrombophlebitis.  Patient presents to the ED with testicular pain, burning with urination with hematuria.  During the work-up, patient had no evidence of gonorrhea or chlamydia on testing.  Patient did have tenderness to bilateral testicles worse on the right than left.  Work-up revealed elevation in white blood cell count to 15,000, hemoglobin, leukocytes, bacteria in the patient's urine.  Again no evidence of gonorrhea or chlamydia.  Imaging revealed bilateral epididymoorchitis with concerns for septic thrombophlebitis on CT scan.  Patient was afebrile but was tachycardic, and was endorsing a significant amount of pain to the testicles.  Given the findings on CT and ultrasound I did discuss the patient with on-call urologist, Dr. Apolinar Junes.  She advises that the likelihood of septic thrombophlebitis developing from epididymoorchitis is very low.  However as patient is in a significant amount of pain, findings of infection exist on lab, vitals and imaging, she recommends admission for IV antibiotics.  I discussed this with the patient and while he is agreeable to admission he states that he has some things at home that he must take care of with animals that need to be fed.  Patient has tried calling multiple friends to take care of of his house and animals but cannot find anybody.  Patient states that he must leave, take care of this and then he will return.  He states that he will likely be back in an hour.  Have informed the patient that  he must sign out AMA if he chooses to do this.  Patient verbalizes understanding of same.  I have stressed the significance of possibly untreated septic thrombophlebitis.  Patient verbalizes understanding that these complications could include worsening infection, loss of body function, or even death.  Patient again states that he will return as soon as he has taken care of his animals..  Patient was given 2 doses of pain medication in the emergency department but has not received antibiotics at this time.  I recommended getting his antibiotic dose before leaving to take care of his  animals but he states that since he is coming back in an hour he will wait until then.  Feel this is reasonable as patient would ultimately require an IV to be placed only to be withdrawn shortly, however I still feel that patient should stay at this time for definitive treatment.  Patient leaves AMA at this time.        FINAL CLINICAL IMPRESSION(S) / ED DIAGNOSES   Final diagnoses:  Orchitis and epididymitis  Septic thrombophlebitis     Rx / DC Orders   ED Discharge Orders     None        Note:  This document was prepared using Dragon voice recognition software and may include unintentional dictation errors.   Darletta Moll, PA-C 01/21/22 2015

## 2022-01-21 NOTE — ED Notes (Signed)
Bladder scan shows 

## 2022-01-21 NOTE — ED Notes (Signed)
Informed by Racheal Patches, PA-C that patient was leaving AMA. Went to patient room and no one was there. Pt has already left.

## 2022-01-22 ENCOUNTER — Inpatient Hospital Stay
Admission: EM | Admit: 2022-01-22 | Discharge: 2022-01-24 | DRG: 872 | Disposition: A | Discharge status: Home / Self Care | Payer: Self-pay | Attending: Hospitalist | Admitting: Hospitalist

## 2022-01-22 ENCOUNTER — Emergency Department: Payer: Self-pay

## 2022-01-22 DIAGNOSIS — F1120 Opioid dependence, uncomplicated: Secondary | ICD-10-CM | POA: Diagnosis present

## 2022-01-22 DIAGNOSIS — N200 Calculus of kidney: Secondary | ICD-10-CM | POA: Diagnosis present

## 2022-01-22 DIAGNOSIS — F17220 Nicotine dependence, chewing tobacco, uncomplicated: Secondary | ICD-10-CM | POA: Diagnosis present

## 2022-01-22 DIAGNOSIS — A4151 Sepsis due to Escherichia coli [E. coli]: Principal | ICD-10-CM | POA: Diagnosis present

## 2022-01-22 DIAGNOSIS — R6520 Severe sepsis without septic shock: Secondary | ICD-10-CM | POA: Diagnosis present

## 2022-01-22 DIAGNOSIS — R310 Gross hematuria: Secondary | ICD-10-CM | POA: Diagnosis present

## 2022-01-22 DIAGNOSIS — A419 Sepsis, unspecified organism: Secondary | ICD-10-CM

## 2022-01-22 DIAGNOSIS — N433 Hydrocele, unspecified: Secondary | ICD-10-CM | POA: Diagnosis present

## 2022-01-22 DIAGNOSIS — N453 Epididymo-orchitis: Secondary | ICD-10-CM | POA: Diagnosis present

## 2022-01-22 LAB — CBC WITH DIFFERENTIAL/PLATELET
Abs Immature Granulocytes: 0.13 10*3/uL — ABNORMAL HIGH (ref 0.00–0.07)
Basophils Absolute: 0.1 10*3/uL (ref 0.0–0.1)
Basophils Relative: 0 %
Eosinophils Absolute: 0 10*3/uL (ref 0.0–0.5)
Eosinophils Relative: 0 %
HCT: 34.9 % — ABNORMAL LOW (ref 39.0–52.0)
Hemoglobin: 11 g/dL — ABNORMAL LOW (ref 13.0–17.0)
Immature Granulocytes: 1 %
Lymphocytes Relative: 8 %
Lymphs Abs: 1.7 10*3/uL (ref 0.7–4.0)
MCH: 26.6 pg (ref 26.0–34.0)
MCHC: 31.5 g/dL (ref 30.0–36.0)
MCV: 84.5 fL (ref 80.0–100.0)
Monocytes Absolute: 0.9 10*3/uL (ref 0.1–1.0)
Monocytes Relative: 4 %
Neutro Abs: 19.3 10*3/uL — ABNORMAL HIGH (ref 1.7–7.7)
Neutrophils Relative %: 87 %
Platelets: 210 10*3/uL (ref 150–400)
RBC: 4.13 MIL/uL — ABNORMAL LOW (ref 4.22–5.81)
RDW: 14.3 % (ref 11.5–15.5)
WBC: 22.1 10*3/uL — ABNORMAL HIGH (ref 4.0–10.5)
nRBC: 0 % (ref 0.0–0.2)

## 2022-01-22 LAB — LACTIC ACID, PLASMA
Lactic Acid, Venous: 1.4 mmol/L (ref 0.5–1.9)
Lactic Acid, Venous: 2.2 mmol/L (ref 0.5–1.9)

## 2022-01-22 LAB — COMPREHENSIVE METABOLIC PANEL
ALT: 17 U/L (ref 0–44)
AST: 17 U/L (ref 15–41)
Albumin: 3.2 g/dL — ABNORMAL LOW (ref 3.5–5.0)
Alkaline Phosphatase: 99 U/L (ref 38–126)
Anion gap: 10 (ref 5–15)
BUN: 10 mg/dL (ref 6–20)
CO2: 24 mmol/L (ref 22–32)
Calcium: 8.6 mg/dL — ABNORMAL LOW (ref 8.9–10.3)
Chloride: 99 mmol/L (ref 98–111)
Creatinine, Ser: 0.7 mg/dL (ref 0.61–1.24)
GFR, Estimated: >60 mL/min (ref >60)
Glucose, Bld: 124 mg/dL — ABNORMAL HIGH (ref 70–99)
Potassium: 3.8 mmol/L (ref 3.5–5.1)
Sodium: 133 mmol/L — ABNORMAL LOW (ref 135–145)
Total Bilirubin: 0.5 mg/dL (ref 0.3–1.2)
Total Protein: 7.4 g/dL (ref 6.5–8.1)

## 2022-01-22 LAB — PROTIME-INR
INR: 1.2 (ref 0.8–1.2)
Prothrombin Time: 14.7 seconds (ref 11.4–15.2)

## 2022-01-22 LAB — HIV ANTIBODY (ROUTINE TESTING W REFLEX): HIV Screen 4th Generation wRfx: NONREACTIVE

## 2022-01-22 MED ORDER — ONDANSETRON HCL 4 MG/2ML IJ SOLN: 4.0000 mg | Freq: Four times a day (QID) | INTRAMUSCULAR | Status: DC | PRN

## 2022-01-22 MED ORDER — HYDROMORPHONE HCL 1 MG/ML IJ SOLN
0.5000 mg | INTRAMUSCULAR | Status: DC | PRN
  Administered 2022-01-22 – 2022-01-23 (×4): 0.5 mg via INTRAVENOUS
  Filled 2022-01-22 (×4): qty 0.5

## 2022-01-22 MED ORDER — MORPHINE SULFATE (PF) 2 MG/ML IV SOLN
2.0000 mg | INTRAVENOUS | Status: DC | PRN
  Administered 2022-01-22: 2 mg via INTRAVENOUS
  Filled 2022-01-22: qty 1

## 2022-01-22 MED ORDER — ONDANSETRON HCL 4 MG PO TABS: 4.0000 mg | ORAL_TABLET | Freq: Four times a day (QID) | ORAL | Status: DC | PRN

## 2022-01-22 MED ORDER — ACETAMINOPHEN 500 MG PO TABS
1000.0000 mg | ORAL_TABLET | Freq: Four times a day (QID) | ORAL | Status: DC | PRN
  Administered 2022-01-22: 1000 mg via ORAL
  Filled 2022-01-22: qty 2

## 2022-01-22 MED ORDER — LACTATED RINGERS IV BOLUS
1000.0000 mL | Freq: Once | INTRAVENOUS | Status: AC
  Administered 2022-01-22: 1000 mL via INTRAVENOUS

## 2022-01-22 MED ORDER — ONDANSETRON HCL 4 MG/2ML IJ SOLN
4.0000 mg | Freq: Once | INTRAMUSCULAR | Status: AC
  Administered 2022-01-22: 4 mg via INTRAVENOUS
  Filled 2022-01-22: qty 2

## 2022-01-22 MED ORDER — LEVOFLOXACIN IN D5W 750 MG/150ML IV SOLN
750.0000 mg | Freq: Once | INTRAVENOUS | Status: AC
  Administered 2022-01-22: 750 mg via INTRAVENOUS
  Filled 2022-01-22: qty 150

## 2022-01-22 MED ORDER — SODIUM CHLORIDE 0.9 % IV SOLN: INTRAVENOUS | Status: DC

## 2022-01-22 MED ORDER — ENOXAPARIN SODIUM 40 MG/0.4ML IJ SOSY
40.0000 mg | PREFILLED_SYRINGE | INTRAMUSCULAR | Status: DC
  Administered 2022-01-22 – 2022-01-23 (×2): 40 mg via SUBCUTANEOUS
  Filled 2022-01-22 (×2): qty 0.4

## 2022-01-22 MED ORDER — MORPHINE SULFATE (PF) 4 MG/ML IV SOLN
4.0000 mg | Freq: Once | INTRAVENOUS | Status: AC
  Administered 2022-01-22: 4 mg via INTRAVENOUS
  Filled 2022-01-22: qty 1

## 2022-01-22 MED ORDER — LEVOFLOXACIN IN D5W 750 MG/150ML IV SOLN
750.0000 mg | INTRAVENOUS | Status: DC
  Administered 2022-01-23 – 2022-01-24 (×2): 750 mg via INTRAVENOUS
  Filled 2022-01-22 (×2): qty 150

## 2022-01-22 NOTE — ED Notes (Signed)
Informed RN bed assigned 

## 2022-01-22 NOTE — Progress Notes (Signed)
Admission profile updated. ?

## 2022-01-22 NOTE — Consult Note (Signed)
Urology Consult  I have been asked to see the patient by Dr. Joylene Igo, for evaluation and management of epididymoorchitis.  Chief Complaint: Gross hematuria, testicular pain, testicular swelling  History of Present Illness: Kenneth Day is a 46 y.o. year old male who presented to the ED yesterday with reports of right testicular pain and swelling associated with gross hematuria.  He was recommended for admission on IV antibiotics, but left AMA.  He returned today.  Scrotal ultrasound revealed bilateral epididymoorchitis.  CT stone study revealed stranding around the right gonadal veins suggestive of septic thrombophlebitis.  He also has a punctate nonobstructing right renal stone.  Admission labs yesterday notable for WBC count 15.0; chlamydia and gonorrhea negative; UA with no nitrites, >50 WBCs/hpf, >50 RBCs/hpf, many bacteria, and WBC clumps.    WBC count is up today, 22.1.  Lactate is 1.4.  Blood cultures pending, urine cultures not ordered.  On antibiotics as below.  Today he reports exquisite testicular pain that started yesterday morning.  He denies a history of similar episodes.  He states he elevated his scrotum yesterday and it improved the pain.  Anti-infectives (From admission, onward)    Start     Dose/Rate Route Frequency Ordered Stop   01/23/22 1000  levofloxacin (LEVAQUIN) IVPB 750 mg        750 mg 100 mL/hr over 90 Minutes Intravenous Every 24 hours 01/22/22 1155     01/22/22 1130  levofloxacin (LEVAQUIN) IVPB 750 mg        750 mg 100 mL/hr over 90 Minutes Intravenous  Once 01/22/22 1121         History reviewed. No pertinent past medical history.  History reviewed. No pertinent surgical history.  Home Medications:  Current Meds  Medication Sig   acetaminophen (TYLENOL) 500 MG tablet Take 1,000 mg by mouth every 6 (six) hours as needed.    Allergies: No Known Allergies  History reviewed. No pertinent family history.  Social History:  reports that he  has never smoked. He uses smokeless tobacco. He reports that he does not currently use drugs. He reports that he does not drink alcohol.  ROS: A complete review of systems was performed.  All systems are negative except for pertinent findings as noted.  Physical Exam:  Vital signs in last 24 hours: Temp:  [98.4 F (36.9 C)-100 F (37.8 C)] 100 F (37.8 C) (06/22 1055) Pulse Rate:  [106-116] 106 (06/22 1055) Resp:  [18] 18 (06/22 1055) BP: (135-157)/(77-93) 135/77 (06/22 1055) SpO2:  [95 %-99 %] 99 % (06/22 1055) Weight:  [83.9 kg] 83.9 kg (06/21 1430) Constitutional:  Alert and oriented, no acute distress HEENT: [place] AT, moist mucus membranes Cardiovascular: No clubbing, cyanosis, or edema Respiratory: Normal respiratory effort GU: Exquisitely tender bilateral descended testes and epididymides.  Exam limited due to exquisite tenderness.  Mild erythema of the scrotum without scrotal wall thickening or crepitus, fluctuance, or purulence. Skin: No rashes, bruises or suspicious lesions Neurologic: Grossly intact, no focal deficits, moving all 4 extremities Psychiatric: Normal mood and affect  Laboratory Data:  Recent Labs    01/21/22 1438 01/22/22 1057  WBC 15.0* 22.1*  HGB 11.3* 11.0*  HCT 35.1* 34.9*   Recent Labs    01/21/22 1438 01/22/22 1057  NA 136 133*  K 3.9 3.8  CL 103 99  CO2 27 24  GLUCOSE 156* 124*  BUN 10 10  CREATININE 0.71 0.70  CALCIUM 8.9 8.6*   Recent Labs    01/22/22  1057  INR 1.2   Urinalysis    Component Value Date/Time   COLORURINE YELLOW (A) 01/21/2022 1438   APPEARANCEUR CLOUDY (A) 01/21/2022 1438   LABSPEC 1.024 01/21/2022 1438   PHURINE 5.0 01/21/2022 1438   GLUCOSEU NEGATIVE 01/21/2022 1438   HGBUR LARGE (A) 01/21/2022 1438   BILIRUBINUR NEGATIVE 01/21/2022 1438   KETONESUR NEGATIVE 01/21/2022 1438   PROTEINUR 100 (A) 01/21/2022 1438   NITRITE NEGATIVE 01/21/2022 1438   LEUKOCYTESUR MODERATE (A) 01/21/2022 1438   Results for  orders placed or performed during the hospital encounter of 01/21/22  Chlamydia/NGC rt PCR ([facility] only)     Status: None   Collection Time: 01/21/22  2:38 PM   Specimen: Urine  Result Value Ref Range Status   Specimen source GC/Chlam URINE, RANDOM  Final   Chlamydia Tr NOT DETECTED NOT DETECTED Final   N gonorrhoeae NOT DETECTED NOT DETECTED Final    Comment: (NOTE) This CT/NG assay has not been evaluated in patients with a history of  hysterectomy. Performed at [facility], [address] [place], [place] [address]     Radiologic Imaging: CT Renal Stone Study  Result Date: 01/21/2022 CLINICAL DATA:  Right testicular pain, hematuria for 4 days EXAM: CT ABDOMEN AND PELVIS WITHOUT CONTRAST TECHNIQUE: Multidetector CT imaging of the abdomen and pelvis was performed following the standard protocol without IV contrast. RADIATION DOSE REDUCTION: This exam was performed according to the departmental dose-optimization program which includes automated exposure control, adjustment of the mA and/or kV according to patient size and/or use of iterative reconstruction technique. COMPARISON:  None Available. FINDINGS: Lower chest: The lung bases are clear. The imaged heart is unremarkable. Hepatobiliary: The liver and gallbladder are unremarkable. There is no biliary ductal dilatation. Pancreas: Unremarkable. Spleen: Unremarkable. Adrenals/Urinary Tract: The adrenals are unremarkable. There is a punctate nonobstructing right lower pole renal stone. No other stones are seen in either kidney or along the course of either ureter. There is no focal parenchymal lesions, within the confines of noncontrast technique. There is no hydronephrosis or hydroureter. There is stranding in the fat along the course of the right gonadal vein which appears separate from the ureter (2-57). There is no organized or drainable fluid collection. The bladder is unremarkable. Stomach/Bowel: The stomach is unremarkable. There  is no evidence of bowel obstruction. There is no abnormal bowel wall thickening or inflammatory change. The appendix is not definitively identified. Vascular/Lymphatic: There is minimal calcified plaque in the nonaneurysmal abdominal aorta. There is no abdominal or pelvic lymphadenopathy. Reproductive: The prostate and seminal vesicles are unremarkable. Small bilateral hydroceles are noted. Other: There is no ascites or free air. Musculoskeletal: There is no acute osseous abnormality or suspicious osseous lesion. There is degenerative change at L5-S1. IMPRESSION: 1. Stranding in the right hemiabdomen centered around the right gonadal veins which is suggestive of septic thrombophlebitis. 2. Punctate nonobstructing right renal stone. No obstructing stone or hydroureteronephrosis. Electronically Signed   By: Lesia Hausen M.D.   On: 01/21/2022 18:36   US SCROTUM W/DOPPLER  Result Date: 01/21/2022 CLINICAL DATA:  Testicular pain for 4 days.  Hematuria. EXAM: SCROTAL ULTRASOUND DOPPLER ULTRASOUND OF THE TESTICLES TECHNIQUE: Complete ultrasound examination of the testicles, epididymis, and other scrotal structures was performed. Color and spectral Doppler ultrasound were also utilized to evaluate blood flow to the testicles. COMPARISON:  None Available. FINDINGS: Right testicle Measurements: 4.6 x 3.0 x 3.4 cm. No mass or microlithiasis visualized. Increased vascularity. Left testicle Measurements: 4.5 x 2.6 x  2.6 cm. No mass or microlithiasis visualized. Increased vascularity Right epididymis:  Increased vascularity. Left epididymis:  Increased vascularity. Hydrocele:  Trace bilateral reactive hydroceles. Varicocele:  None visualized. Pulsed Doppler interrogation of both testes demonstrates normal low resistance arterial and venous waveforms bilaterally. IMPRESSION: 1. Increased vascularity of the bilateral testicles and epididymides, consistent with bilateral epididymo-orchitis. Electronically Signed   By: Obie Dredge M.D.   On: 01/21/2022 17:53    Assessment & Plan:  47 year old male admitted with bilateral epididymoorchitis after leaving AMA from the emergency room yesterday.  No evidence of abscess on exam today, though exam was limited due to his exquisite tenderness.  We discussed scrotal support today including scrotal elevation, cryotherapy, and compressive underwear.  I have ordered an add-on urine culture.  He will require a total of 14 days of culture appropriate, tissue penetrating antibiotics, recommend levofloxacin, ciprofloxacin, or Bactrim.  Given limited exam today, we will plan to repeat exam tomorrow in case of organizing abscess.  Thank you for involving me in this patient's care, I will continue to follow along.  Carman Ching, PA-C 01/22/2022 12:23 PM

## 2022-01-22 NOTE — ED Triage Notes (Signed)
Pt states he was seen here yesterday for bilat testicle pain and swelling and was told he might be septic. Pt left ama to "take care of some stuff" and was supposed to come back in an hour but couldn't get back here until now. Pt states they wanted to admit him last night for antibiotics.

## 2022-01-22 NOTE — H&P (Addendum)
History and Physical    Patient: Kenneth Day QIO:[id number] DOB: July 13, 1976 DOA: 01/22/2022 DOS: the patient was seen and examined on 01/22/2022 PCP: Pcp, No  Patient coming from: Home  Chief Complaint:  Chief Complaint  Patient presents with   Testicle Pain   HPI: Kenneth Day is a 46 y.o. male with no significant past medical history who presents to the ER for evaluation of a 4-day history of hematuria, testicular pain and swelling as well as foul-smelling urine. Patient states his symptoms initially started with hematuria and then progressed to testicular pain and swelling.  He denies having any fever or chills and denies having any trauma or any penile discharge.  Does not have a known history of kidney stones.  Does not have a history of STD/STIs and states that he is in a monogamous relationship with one individual with whom he has unprotected sex.  He was seen in the ER 1 day prior to his admission and had an ultrasound which was consistent with epididymoorchitis bilaterally.  No evidence of testicular torsion. Patient was offered hospitalization for IV antibiotics but had to leave AGAINST MEDICAL ADVICE because he had to make alternative arrangements for his pets. He returns to the ER today due to worsening pain and swelling and labs show an increase in his white cell count from 15,000 >> 22,000.  He also has a low-grade fever with a Tmax of 100. He denies having any  no cough, no chest pain, no shortness of breath, no headache, no dizziness, lightheadedness, no abdominal pain, no changes in his bowel habits, no nausea, no vomiting, no blurred vision or any focal deficit.      Review of Systems: As mentioned in the history of present illness. All other systems reviewed and are negative. History reviewed. No pertinent past medical history. History reviewed. No pertinent surgical history. Social History:  reports that he has never smoked. He uses smokeless tobacco. He reports  that he does not currently use drugs. He reports that he does not drink alcohol.  No Known Allergies  History reviewed. No pertinent family history.  Prior to Admission medications   Medication Sig Start Date End Date Taking? Authorizing Provider  acetaminophen (TYLENOL) 500 MG tablet Take 1,000 mg by mouth every 6 (six) hours as needed.   Yes [provider]  cyclobenzaprine (FLEXERIL) 10 MG tablet Take 1 tablet (10 mg total) by mouth 2 (two) times daily as needed for muscle spasms. Patient not taking: Reported on 12/28/2019 05/20/12   Johnsie Kindred, NP  methylPREDNISolone (MEDROL DOSEPAK) 4 MG tablet Take as directed Patient not taking: Reported on 12/28/2019 05/20/12   Johnsie Kindred, NP  sulfamethoxazole-trimethoprim (BACTRIM DS,SEPTRA DS) 800-160 MG tablet Take 1 tablet by mouth 2 (two) times daily. Patient not taking: Reported on 12/28/2019 04/06/18   Menshew, Charlesetta Ivory, PA-C  traMADol (ULTRAM) 50 MG tablet Take 1 tablet (50 mg total) by mouth every 6 (six) hours as needed for pain. Patient not taking: Reported on 12/28/2019 05/20/12   Johnsie Kindred, NP    Physical Exam: Vitals:   01/22/22 1055  BP: 135/77  Pulse: (!) 106  Resp: 18  Temp: 100 F (37.8 C)  TempSrc: Oral  SpO2: 99%   Physical Exam Vitals and nursing note reviewed.  Constitutional:      Appearance: Normal appearance.     Comments: Appears uncomfortable  HENT:     Head: Normocephalic and atraumatic.     Nose: Nose  normal.     Mouth/Throat:     Mouth: Mucous membranes are moist.  Eyes:     Pupils: Pupils are equal, round, and reactive to light.  Cardiovascular:     Rate and Rhythm: Tachycardia present.  Pulmonary:     Effort: Pulmonary effort is normal.     Breath sounds: Normal breath sounds.  Abdominal:     General: Abdomen is flat. Bowel sounds are normal.     Palpations: Abdomen is soft.  Musculoskeletal:        General: Normal range of motion.     Cervical back: Normal  range of motion and neck supple.  Skin:    General: Skin is warm and dry.  Neurological:     General: No focal deficit present.     Mental Status: He is alert.  Psychiatric:        Mood and Affect: Mood normal.        Behavior: Behavior normal.     Data Reviewed: Relevant notes from primary care and specialist visits, past discharge summaries as available in EHR, including Care Everywhere. Prior diagnostic testing as pertinent to current admission diagnoses Updated medications and problem lists for reconciliation ED course, including vitals, labs, imaging, treatment and response to treatment Triage notes, nursing and pharmacy notes and ED provider's notes Notable results as noted in HPI Labs reviewed.  Lactic acid 1.4, sodium 133, potassium 3.8, chloride 99, bicarb 24, glucose 124, BUN 10, creatinine 0.70, calcium 8.6, total protein 7.4, albumin 3.2, AST 17, ALT 17, alkaline phosphatase 99, PT 14.7, INR 1.2, white count 22.1, hemoglobin 11.0, hematocrit 34.9, RDW 14.3, platelet count 210, lipase 23 Random urine is negative for chlamydia and gonorrhea Doppler ultrasound of the testicles shows  Increased vascularity of the bilateral testicles and epididymides, consistent with bilateral epididymo-orchitis. CT scan of abdomen and pelvis shows stranding in the right hemiabdomen centered around the right gonadal veins which is suggestive of septic thrombophlebitis. Punctate nonobstructing right renal stone. No obstructing stone or hydroureteronephrosis.  There are no new results to review at this time.  Assessment and Plan: * Epididymoorchitis Patient presents to the ER for evaluation pain and swelling involving both testicles as well as hematuria and passage of foul-smelling urine. Testicular ultrasound is consistent with epididymo orchitis We will continue fluoroquinolone as initiated in the ER Follow-up results of urine culture Pain control Consults urology      Advance Care  Planning:   Code Status: Full Code   Consults: Urology  Family Communication: Greater than 50% of time was spent discussing patient's condition and plan of care with him at the bedside.  All questions and concerns have been addressed.  He verbalizes understanding and agrees to the plan.  Severity of Illness:  The appropriate patient status for this patient is INPATIENT. Inpatient status is judged to be reasonable and necessary in order to provide the required intensity of service to ensure the patient's safety. The patient's presenting symptoms, physical exam findings, and initial radiographic and laboratory data in the context of their chronic comorbidities is felt to place them at high risk for further clinical deterioration. Furthermore, it is not anticipated that the patient will be medically stable for discharge from the hospital within 2 midnights of admission.    * I certify that at the point of admission it is my clinical judgment that the patient will require inpatient hospital care spanning beyond 2 midnights from the point of admission due to high intensity of service, high  risk for further deterioration and high frequency of surveillance required.*   Author: Lucile Shutters, MD 01/22/2022 1:32 PM  For on call review [email or web address].

## 2022-01-22 NOTE — Plan of Care (Signed)
  Problem: Elimination: Goal: Will not experience complications related to bowel motility Outcome: Progressing Goal: Will not experience complications related to urinary retention Outcome: Progressing   Problem: Pain Managment: Goal: General experience of comfort will improve Outcome: Progressing   Problem: Skin Integrity: Goal: Risk for impaired skin integrity will decrease Outcome: Progressing   

## 2022-01-22 NOTE — Assessment & Plan Note (Addendum)
Patient presents to the ER for evaluation pain and swelling involving both testicles as well as hematuria and passage of foul-smelling urine. Testicular ultrasound is consistent with epididymo orchitis --started on fluoroquinolone in the ER. --urology consulted Plan: Follow-up results of urine culture --cont IV Levaquin -Scrotal support: Scrotal elevation, cryotherapy, and compressive underwear

## 2022-01-23 DIAGNOSIS — F1120 Opioid dependence, uncomplicated: Secondary | ICD-10-CM

## 2022-01-23 LAB — BASIC METABOLIC PANEL
Anion gap: 4 — ABNORMAL LOW (ref 5–15)
BUN: 9 mg/dL (ref 6–20)
CO2: 26 mmol/L (ref 22–32)
Calcium: 8.2 mg/dL — ABNORMAL LOW (ref 8.9–10.3)
Chloride: 105 mmol/L (ref 98–111)
Creatinine, Ser: 0.61 mg/dL (ref 0.61–1.24)
GFR, Estimated: >60 mL/min (ref >60)
Glucose, Bld: 103 mg/dL — ABNORMAL HIGH (ref 70–99)
Potassium: 3.7 mmol/L (ref 3.5–5.1)
Sodium: 135 mmol/L (ref 135–145)

## 2022-01-23 LAB — CBC
HCT: 30.3 % — ABNORMAL LOW (ref 39.0–52.0)
Hemoglobin: 9.8 g/dL — ABNORMAL LOW (ref 13.0–17.0)
MCH: 27.1 pg (ref 26.0–34.0)
MCHC: 32.3 g/dL (ref 30.0–36.0)
MCV: 83.9 fL (ref 80.0–100.0)
Platelets: 176 10*3/uL (ref 150–400)
RBC: 3.61 MIL/uL — ABNORMAL LOW (ref 4.22–5.81)
RDW: 14.4 % (ref 11.5–15.5)
WBC: 17.5 10*3/uL — ABNORMAL HIGH (ref 4.0–10.5)
nRBC: 0 % (ref 0.0–0.2)

## 2022-01-23 MED ORDER — OXYCODONE HCL 5 MG PO TABS
5.0000 mg | ORAL_TABLET | ORAL | Status: DC | PRN
  Administered 2022-01-23 – 2022-01-24 (×4): 10 mg via ORAL
  Filled 2022-01-23 (×4): qty 2

## 2022-01-23 MED ORDER — METHADONE HCL 10 MG PO TABS: 100.0000 mg | ORAL_TABLET | Freq: Every day | ORAL | Status: DC

## 2022-01-23 MED ORDER — METHADONE HCL 10 MG/ML PO CONC
100.0000 mg | Freq: Every day | ORAL | Status: DC
  Administered 2022-01-23 – 2022-01-24 (×2): 100 mg via ORAL
  Filled 2022-01-23 (×2): qty 10

## 2022-01-23 NOTE — Plan of Care (Signed)
  Problem: Education: Goal: Knowledge of General Education information will improve Description Including pain rating scale, medication(s)/side effects and non-pharmacologic comfort measures Outcome: Progressing   

## 2022-01-23 NOTE — Assessment & Plan Note (Signed)
--  cont methadone 100 mg daily

## 2022-01-24 DIAGNOSIS — A419 Sepsis, unspecified organism: Secondary | ICD-10-CM

## 2022-01-24 LAB — CBC
HCT: 30.9 % — ABNORMAL LOW (ref 39.0–52.0)
Hemoglobin: 9.9 g/dL — ABNORMAL LOW (ref 13.0–17.0)
MCH: 26.5 pg (ref 26.0–34.0)
MCHC: 32 g/dL (ref 30.0–36.0)
MCV: 82.6 fL (ref 80.0–100.0)
Platelets: 212 10*3/uL (ref 150–400)
RBC: 3.74 MIL/uL — ABNORMAL LOW (ref 4.22–5.81)
RDW: 14.4 % (ref 11.5–15.5)
WBC: 10.2 10*3/uL (ref 4.0–10.5)
nRBC: 0 % (ref 0.0–0.2)

## 2022-01-24 LAB — BASIC METABOLIC PANEL
Anion gap: 5 (ref 5–15)
BUN: 10 mg/dL (ref 6–20)
CO2: 25 mmol/L (ref 22–32)
Calcium: 8.6 mg/dL — ABNORMAL LOW (ref 8.9–10.3)
Chloride: 105 mmol/L (ref 98–111)
Creatinine, Ser: 0.68 mg/dL (ref 0.61–1.24)
GFR, Estimated: >60 mL/min (ref >60)
Glucose, Bld: 126 mg/dL — ABNORMAL HIGH (ref 70–99)
Potassium: 3.5 mmol/L (ref 3.5–5.1)
Sodium: 135 mmol/L (ref 135–145)

## 2022-01-24 LAB — URINE CULTURE: Culture: >=100000 — AB

## 2022-01-24 LAB — MAGNESIUM: Magnesium: 2.3 mg/dL (ref 1.7–2.4)

## 2022-01-24 MED ORDER — OXYCODONE HCL 5 MG PO TABS: 5.0000 mg | ORAL_TABLET | Freq: Four times a day (QID) | ORAL | 0 refills | Status: AC | PRN

## 2022-01-24 MED ORDER — LEVOFLOXACIN 500 MG PO TABS: 500.0000 mg | ORAL_TABLET | Freq: Every day | ORAL | 0 refills | Status: AC

## 2022-01-24 NOTE — Progress Notes (Signed)
Mobility Specialist - Progress Note   01/24/22 1140  Mobility  Activity Ambulated independently in room  Level of Assistance Modified independent, requires aide device or extra time  Assistive Device Front wheel walker  Distance Ambulated (ft) 320 ft  Activity Response Tolerated well  $Mobility charge 1 Mobility    Kenneth Day Mobility Specialist 01/24/22, 2:28 PM

## 2022-01-27 LAB — CULTURE, BLOOD (ROUTINE X 2)
Culture: NO GROWTH
Culture: NO GROWTH
Special Requests: ADEQUATE

## 2022-01-29 NOTE — ED Provider Notes (Signed)
[facility] Provider Note    Event Date/Time   First MD Initiated Contact with Patient 01/22/22 1126     (approximate)   History   Testicle Pain   HPI  Kenneth Day is a 46 y.o. male who came into the hospital complaining of pain in his testicles.  Please note this is the third time I have dictated this note it keeps disappearing I do not know where it goes but I have dictated this note 3 times.  This patient came in the day prior to my visit with testicle pain and had a CT done which showed thrombosis of the right gonadal veins.  Dr. Apolinar Junes was called and recommended admission for IV antibiotics.  The patient was unable to do this because he had to get someone to take care of his animals he said he be back in an hour and left AMA.  He came in the next day which would be the day that I saw him apologize because he could not get anybody to see his animals and I admitted him for IV antibiotic therapy based on a white count that had gone from 15,000-22,000 and the fact that he had been recommended for admission and IV antibiotics the day previously.  His testicles more were more swollen than they had been.      Physical Exam   Triage Vital Signs: ED Triage Vitals  Enc Vitals Group     BP 01/22/22 1055 135/77     Pulse Rate 01/22/22 1055 (!) 106     Resp 01/22/22 1055 18     Temp 01/22/22 1055 100 F (37.8 C)     Temp Source 01/22/22 1055 Oral     SpO2 01/22/22 1055 99 %     Weight 01/22/22 1800 184 lb 15.5 oz (83.9 kg)     Height 01/22/22 1800 6' 0.01" (1.829 m)     Head Circumference --      Peak Flow --      Pain Score 01/22/22 1055 10     Pain Loc --      Pain Edu? --      Excl. in GC? --     Most recent vital signs: Vitals:   01/24/22 0421 01/24/22 0750  BP: 121/76 110/76  Pulse: 83 73  Resp: 18 18  Temp: 98.4 F (36.9 C) 98.2 F (36.8 C)  SpO2: 99% 96%     General: Awake, alert complaining of testicular pain CV:  Good peripheral  perfusion.  Heart regular rate and rhythm somewhat tacky Resp:  Normal effort.  Lungs are clear Abd:  No distention.  Abdomen is soft nontender bowel sounds are positive GU: Testicles are red swollen and tender   ED Results / Procedures / Treatments   Labs (all labs ordered are listed, but only abnormal results are displayed) Labs Reviewed  URINE CULTURE - Abnormal; Notable for the following components:      Result Value   Culture >=100,000 COLONIES/mL ESCHERICHIA COLI (*)    Organism ID, Bacteria ESCHERICHIA COLI (*)    All other components within normal limits  COMPREHENSIVE METABOLIC PANEL - Abnormal; Notable for the following components:   Sodium 133 (*)    Glucose, Bld 124 (*)    Calcium 8.6 (*)    Albumin 3.2 (*)    All other components within normal limits  LACTIC ACID, PLASMA - Abnormal; Notable for the following components:   Lactic Acid, Venous 2.2 (*)  All other components within normal limits  CBC WITH DIFFERENTIAL/PLATELET - Abnormal; Notable for the following components:   WBC 22.1 (*)    RBC 4.13 (*)    Hemoglobin 11.0 (*)    HCT 34.9 (*)    Neutro Abs 19.3 (*)    Abs Immature Granulocytes 0.13 (*)    All other components within normal limits  CBC - Abnormal; Notable for the following components:   WBC 17.5 (*)    RBC 3.61 (*)    Hemoglobin 9.8 (*)    HCT 30.3 (*)    All other components within normal limits  BASIC METABOLIC PANEL - Abnormal; Notable for the following components:   Glucose, Bld 103 (*)    Calcium 8.2 (*)    Anion gap 4 (*)    All other components within normal limits  BASIC METABOLIC PANEL - Abnormal; Notable for the following components:   Glucose, Bld 126 (*)    Calcium 8.6 (*)    All other components within normal limits  CBC - Abnormal; Notable for the following components:   RBC 3.74 (*)    Hemoglobin 9.9 (*)    HCT 30.9 (*)    All other components within normal limits  CULTURE, BLOOD (ROUTINE X 2)  CULTURE, BLOOD (ROUTINE X  2)  LACTIC ACID, PLASMA  PROTIME-INR  HIV ANTIBODY (ROUTINE TESTING W REFLEX)  MAGNESIUM     EKG  Not done during my visit   RADIOLOGY Not done during my visit   PROCEDURES:  Critical Care performed:   Procedures   MEDICATIONS ORDERED IN ED: Medications  levofloxacin (LEVAQUIN) IVPB 750 mg (0 mg Intravenous Stopped 01/22/22 1319)  morphine (PF) 4 MG/ML injection 4 mg (4 mg Intravenous Given 01/22/22 1132)  ondansetron (ZOFRAN) injection 4 mg (4 mg Intravenous Given 01/22/22 1131)  lactated ringers bolus 1,000 mL (0 mLs Intravenous Stopped 01/22/22 1537)     IMPRESSION / MDM / ASSESSMENT AND PLAN / ED COURSE  I reviewed the triage vital signs and the nursing notes.   Differential diagnosis includes, but is not limited to, epididymoorchitis and infection with thrombosis of the gonadal veins  Patient's presentation is most consistent with acute presentation with potential threat to life or bodily function. The patient is on the cardiac monitor to evaluate for evidence of arrhythmia and/or significant heart rate changes.  None were seen    FINAL CLINICAL IMPRESSION(S) / ED DIAGNOSES   Final diagnoses:  Sepsis, due to unspecified organism, unspecified whether acute organ dysfunction present [facility])     Rx / DC Orders   ED Discharge Orders          Ordered    levofloxacin (LEVAQUIN) 500 MG tablet  Daily        01/24/22 1251    oxyCODONE (OXY IR/ROXICODONE) 5 MG immediate release tablet  Every 6 hours PRN        01/24/22 1251    Discharge instructions       Comments: You are discharged on 11 more days of antibiotic Levaquin to complete a 14-day course of infection in your urinary track and testicles.  - -   01/24/22 1251             Note:  This document was prepared using Dragon voice recognition software and may include unintentional dictation errors.   Arnaldo Natal, MD 01/29/22 [phone]

## 2022-01-29 NOTE — ED Provider Notes (Signed)
[facility] Provider Note    Event Date/Time   First MD Initiated Contact with Patient 01/22/22 1126     (approximate)   History   Testicle Pain   HPI  Kenneth Day is a 46 y.o. male who I have already dictated 2 notes on in fact I dictated out in third note which is disappeared but the other 2 notes are in the computer please find them     Physical Exam   Triage Vital Signs: ED Triage Vitals  Enc Vitals Group     BP 01/22/22 1055 135/77     Pulse Rate 01/22/22 1055 (!) 106     Resp 01/22/22 1055 18     Temp 01/22/22 1055 100 F (37.8 C)     Temp Source 01/22/22 1055 Oral     SpO2 01/22/22 1055 99 %     Weight 01/22/22 1800 184 lb 15.5 oz (83.9 kg)     Height 01/22/22 1800 6' 0.01" (1.829 m)     Head Circumference --      Peak Flow --      Pain Score 01/22/22 1055 10     Pain Loc --      Pain Edu? --      Excl. in GC? --     Most recent vital signs: Vitals:   01/24/22 0421 01/24/22 0750  BP: 121/76 110/76  Pulse: 83 73  Resp: 18 18  Temp: 98.4 F (36.9 C) 98.2 F (36.8 C)  SpO2: 99% 96%       ED Results / Procedures / Treatments   Labs (all labs ordered are listed, but only abnormal results are displayed) Labs Reviewed  URINE CULTURE - Abnormal; Notable for the following components:      Result Value   Culture >=100,000 COLONIES/mL ESCHERICHIA COLI (*)    Organism ID, Bacteria ESCHERICHIA COLI (*)    All other components within normal limits  COMPREHENSIVE METABOLIC PANEL - Abnormal; Notable for the following components:   Sodium 133 (*)    Glucose, Bld 124 (*)    Calcium 8.6 (*)    Albumin 3.2 (*)    All other components within normal limits  LACTIC ACID, PLASMA - Abnormal; Notable for the following components:   Lactic Acid, Venous 2.2 (*)    All other components within normal limits  CBC WITH DIFFERENTIAL/PLATELET - Abnormal; Notable for the following components:   WBC 22.1 (*)    RBC 4.13 (*)    Hemoglobin  11.0 (*)    HCT 34.9 (*)    Neutro Abs 19.3 (*)    Abs Immature Granulocytes 0.13 (*)    All other components within normal limits  CBC - Abnormal; Notable for the following components:   WBC 17.5 (*)    RBC 3.61 (*)    Hemoglobin 9.8 (*)    HCT 30.3 (*)    All other components within normal limits  BASIC METABOLIC PANEL - Abnormal; Notable for the following components:   Glucose, Bld 103 (*)    Calcium 8.2 (*)    Anion gap 4 (*)    All other components within normal limits  BASIC METABOLIC PANEL - Abnormal; Notable for the following components:   Glucose, Bld 126 (*)    Calcium 8.6 (*)    All other components within normal limits  CBC - Abnormal; Notable for the following components:   RBC 3.74 (*)    Hemoglobin 9.9 (*)  HCT 30.9 (*)    All other components within normal limits  CULTURE, BLOOD (ROUTINE X 2)  CULTURE, BLOOD (ROUTINE X 2)  LACTIC ACID, PLASMA  PROTIME-INR  HIV ANTIBODY (ROUTINE TESTING W REFLEX)  MAGNESIUM     EKG     RADIOLOGY   PROCEDURES:  Critical Care performed:  Procedures   MEDICATIONS ORDERED IN ED: Medications  levofloxacin (LEVAQUIN) IVPB 750 mg (0 mg Intravenous Stopped 01/22/22 1319)  morphine (PF) 4 MG/ML injection 4 mg (4 mg Intravenous Given 01/22/22 1132)  ondansetron (ZOFRAN) injection 4 mg (4 mg Intravenous Given 01/22/22 1131)  lactated ringers bolus 1,000 mL (0 mLs Intravenous Stopped 01/22/22 1537)     IMPRESSION / MDM / ASSESSMENT AND PLAN / ED COURSE  I reviewed the triage vital signs and the nursing notes.                                  FINAL CLINICAL IMPRESSION(S) / ED DIAGNOSES   Final diagnoses:  Sepsis, due to unspecified organism, unspecified whether acute organ dysfunction present (HCC)     Rx / DC Orders   ED Discharge Orders          Ordered    levofloxacin (LEVAQUIN) 500 MG tablet  Daily        01/24/22 1251    oxyCODONE (OXY IR/ROXICODONE) 5 MG immediate release tablet  Every 6 hours PRN         01/24/22 1251    Discharge instructions       Comments: You are discharged on 11 more days of antibiotic Levaquin to complete a 14-day course of infection in your urinary track and testicles.  - -   01/24/22 1251             Note:  This document was prepared using Dragon voice recognition software and may include unintentional dictation errors.   Arnaldo Natal, MD 01/29/22 [phone]

## 2022-07-30 ENCOUNTER — Other Ambulatory Visit: Payer: Self-pay

## 2022-07-30 ENCOUNTER — Emergency Department
Admission: EM | Admit: 2022-07-30 | Discharge: 2022-07-30 | Disposition: A | Discharge status: Home / Self Care | Payer: Self-pay | Attending: Emergency Medicine | Admitting: Emergency Medicine

## 2022-07-30 DIAGNOSIS — F1990 Other psychoactive substance use, unspecified, uncomplicated: Secondary | ICD-10-CM

## 2022-07-30 DIAGNOSIS — Z79899 Other long term (current) drug therapy: Secondary | ICD-10-CM | POA: Insufficient documentation

## 2022-07-30 DIAGNOSIS — F1910 Other psychoactive substance abuse, uncomplicated: Secondary | ICD-10-CM | POA: Insufficient documentation

## 2022-07-30 LAB — URINE DRUG SCREEN, QUALITATIVE (ARMC ONLY)
Amphetamines, Ur Screen: POSITIVE — AB
Barbiturates, Ur Screen: NOT DETECTED
Benzodiazepine, Ur Scrn: NOT DETECTED
Cannabinoid 50 Ng, Ur ~~LOC~~: NOT DETECTED
Cocaine Metabolite,Ur ~~LOC~~: NOT DETECTED
MDMA (Ecstasy)Ur Screen: NOT DETECTED
Methadone Scn, Ur: POSITIVE — AB
Opiate, Ur Screen: POSITIVE — AB
Phencyclidine (PCP) Ur S: NOT DETECTED
Tricyclic, Ur Screen: NOT DETECTED

## 2022-07-30 NOTE — ED Provider Notes (Signed)
[facility] Provider Note    Event Date/Time   First MD Initiated Contact with Patient 07/30/22 1854     (approximate)   History   Drug Overdose   HPI  Kenneth Day is a 46 y.o. male   presents to the ER in police custody bc he took a small amount of fentanyl.  Patient states it was a small amount of powder, no cp/sob/drowsiness.  Police would like him cleared prior to going to jail      Physical Exam   Triage Vital Signs: ED Triage Vitals  Enc Vitals Group     BP 07/30/22 1805 (!) 164/121     Pulse Rate 07/30/22 1805 96     Resp 07/30/22 1805 20     Temp 07/30/22 1805 98.3 F (36.8 C)     Temp Source 07/30/22 1805 Oral     SpO2 07/30/22 1805 95 %     Weight 07/30/22 1803 185 lb 3 oz (84 kg)     Height 07/30/22 1803 6' (1.829 m)     Head Circumference --      Peak Flow --      Pain Score 07/30/22 1803 0     Pain Loc --      Pain Edu? --      Excl. in GC? --     Most recent vital signs: Vitals:   07/30/22 1805  BP: (!) 164/121  Pulse: 96  Resp: 20  Temp: 98.3 F (36.8 C)  SpO2: 95%     General: Awake, no distress.   CV:  Good peripheral perfusion. regular rate and  rhythm Resp:  Normal effort. Lungs cta Abd:  No distention.   Other:      ED Results / Procedures / Treatments   Labs (all labs ordered are listed, but only abnormal results are displayed) Labs Reviewed  URINE DRUG SCREEN, QUALITATIVE ([facility] ONLY) - Abnormal; Notable for the following components:      Result Value   Amphetamines, Ur Screen POSITIVE (*)    Opiate, Ur Screen POSITIVE (*)    Methadone Scn, Ur POSITIVE (*)    All other components within normal limits     EKG  ekg   RADIOLOGY     PROCEDURES:   Procedures   MEDICATIONS ORDERED IN ED: Medications - No data to display   IMPRESSION / MDM / ASSESSMENT AND PLAN / ED COURSE  I reviewed the triage vital signs and the nursing notes.                              Differential  diagnosis includes, but is not limited to, drug overdose, drug use, drug ingestion  Patient's presentation is most consistent with acute complicated illness / injury requiring diagnostic workup.    EKG nsr, see physician read  Uds shows opiates, amphetamine, and methadone  Patient remains stable, no cp/sob/or weakness is alert and oriented will d/c patient in [place] [employer] custody      FINAL CLINICAL IMPRESSION(S) / ED DIAGNOSES   Final diagnoses:  Polysubstance use disorder     Rx / DC Orders   ED Discharge Orders     None        Note:  This document was prepared using Dragon voice recognition software and may include unintentional dictation errors.    Faythe Ghee, PA-C 07/30/22 1916    Minna Antis, MD  07/30/22 2057  

## 2022-07-30 NOTE — ED Provider Triage Note (Signed)
Emergency Medicine Provider Triage Evaluation Note  Kenneth Day , a 46 y.o. male  was evaluated in triage.  Pt complains of drug ingestion, took some powdered fentanyl about 2 hours ago, in police custody.  Review of Systems  Positive:  Negative:   Physical Exam  Ht 6' (1.829 m)   Wt 84 kg   BMI 25.12 kg/m  Gen:   Awake, no distress   Resp:  Normal effort  MSK:   Moves extremities without difficulty  Other:    Medical Decision Making  Medically screening exam initiated at 6:06 PM.  Appropriate orders placed.  Kenneth Day was informed that the remainder of the evaluation will be completed by another provider, this initial triage assessment does not replace that evaluation, and the importance of remaining in the ED until their evaluation is complete.  Labs EKG Brynda Peon, PA-C 07/30/22 Merrily Brittle

## 2022-07-30 NOTE — ED Triage Notes (Signed)
Pt in via police custody. Pt reports got pulled and took some fentanyl.

## 2022-07-30 NOTE — Discharge Instructions (Signed)
Patient has methadone, opiates, and amphetamines in his urine, is stable and may go to jail

## 2023-02-07 IMAGING — CT CT RENAL STONE PROTOCOL
2 of 7 series · 15 of 46 positions shown, 17 images · non-contrast
Comparison: None Available.

CLINICAL DATA: Right testicular pain, hematuria for 4 days



[Series 2: stone full standard · axial · 0.89mm/px · z∈[-1288,-793]mm · 12 of 111 slices shown, 14 images]
[im 6/111  soft-tissue]
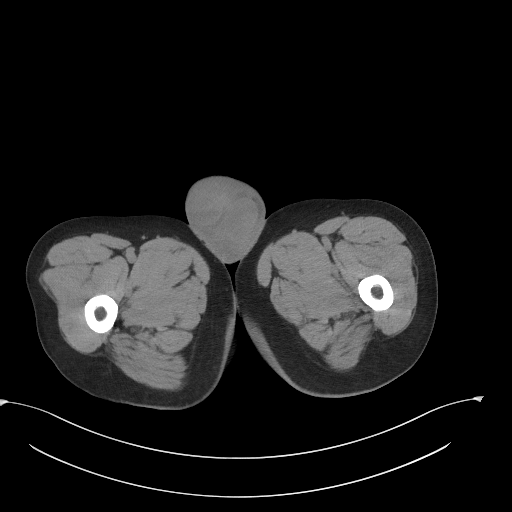
[im 6/111  bone]
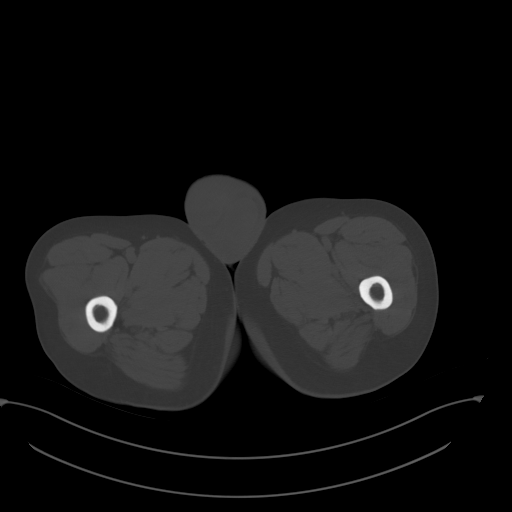
[im 18/111  soft-tissue]
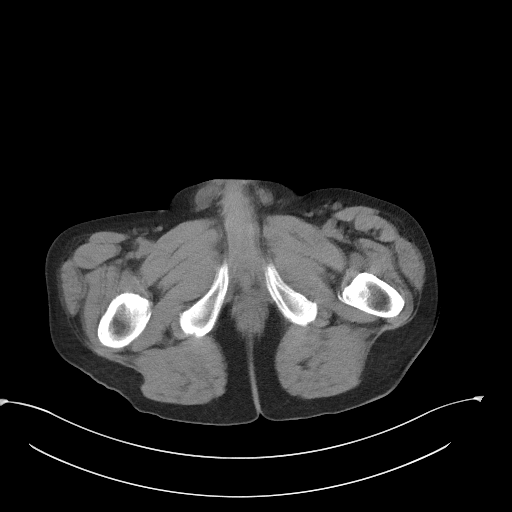
[im 24/111  soft-tissue]
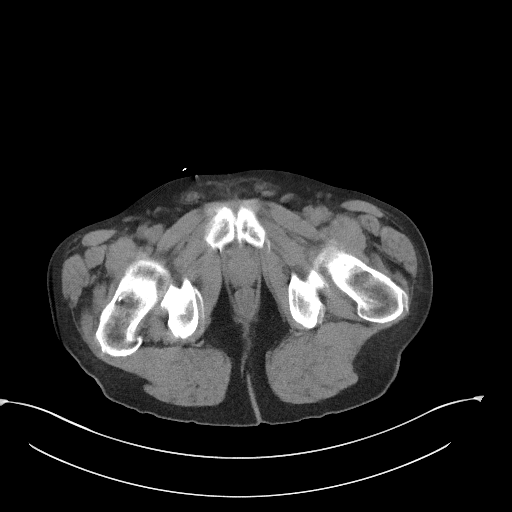
[im 35/111  soft-tissue]
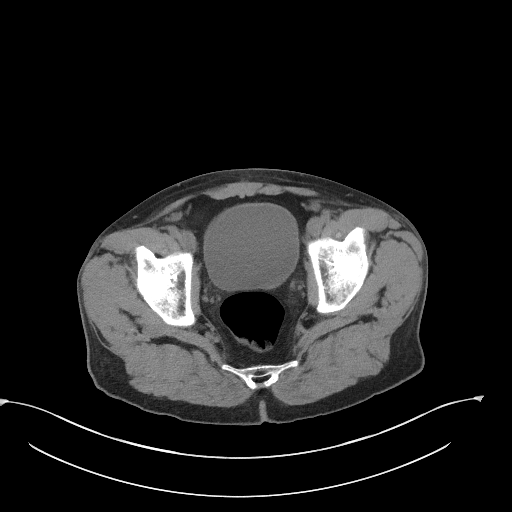
[im 41/111  soft-tissue]
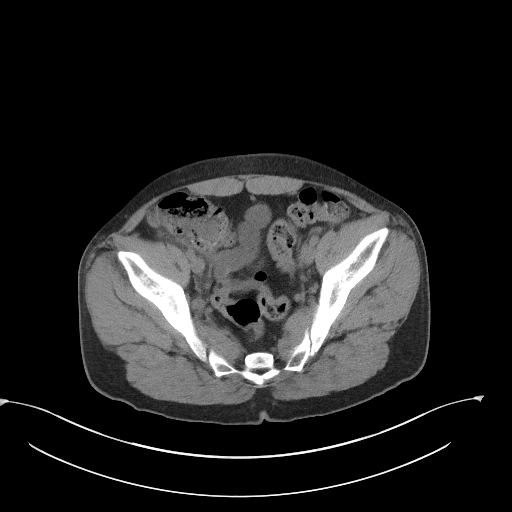
[im 53/111  soft-tissue]
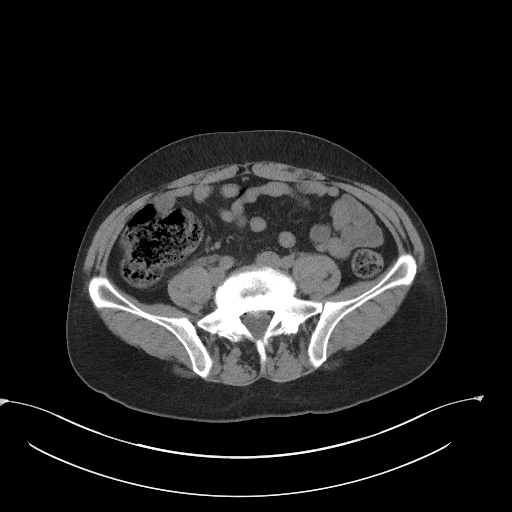
[im 58/111  soft-tissue]
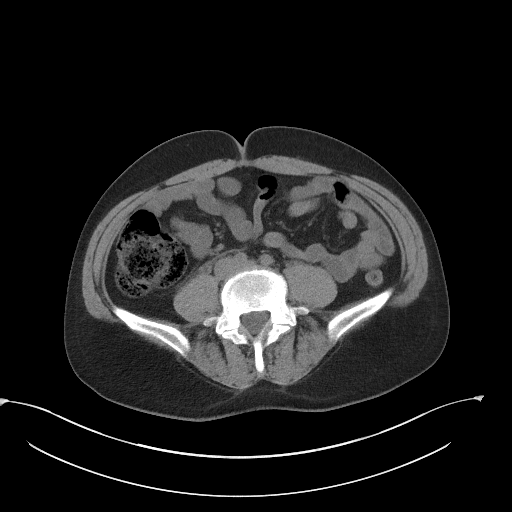
[im 70/111  soft-tissue]
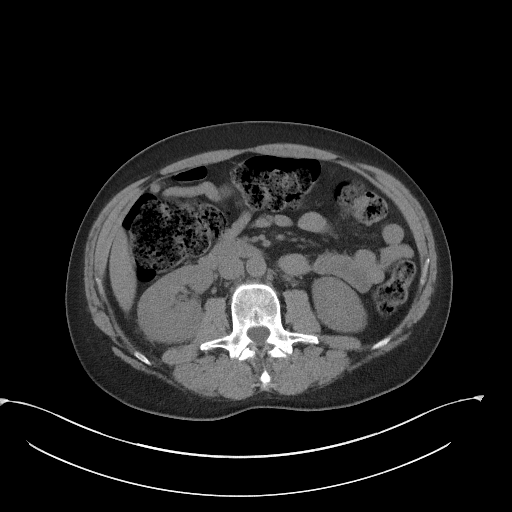
[im 76/111  soft-tissue]
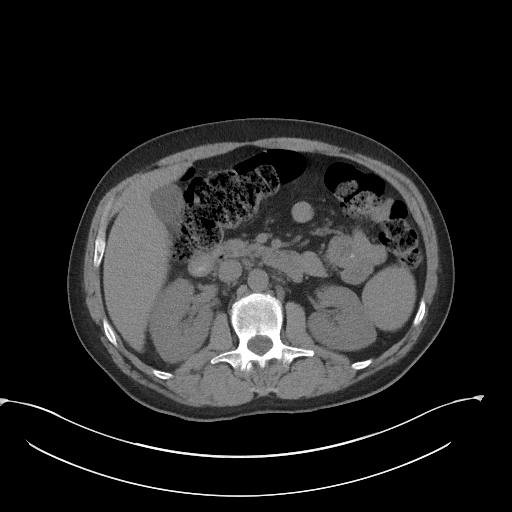
[im 76/111  bone]
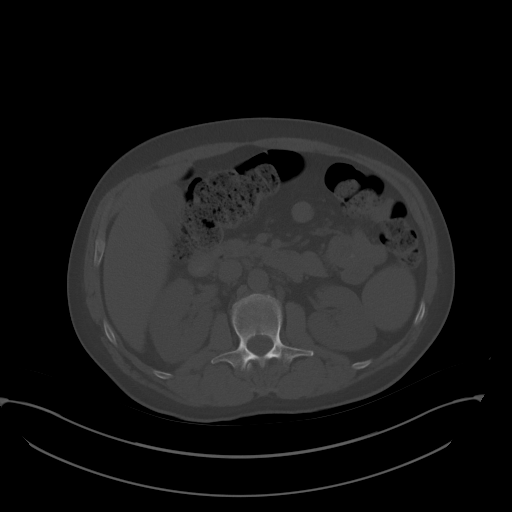
[im 87/111  soft-tissue]
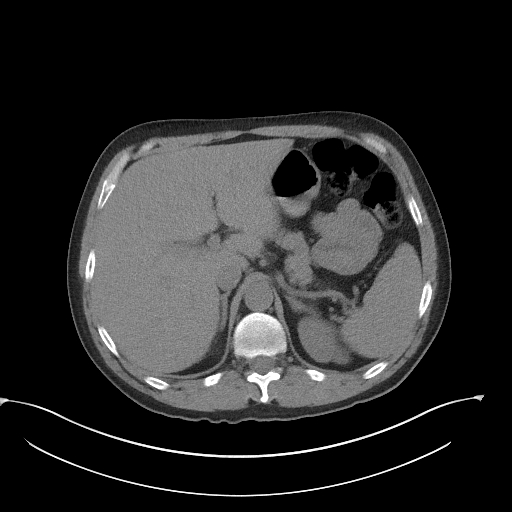
[im 93/111  soft-tissue]
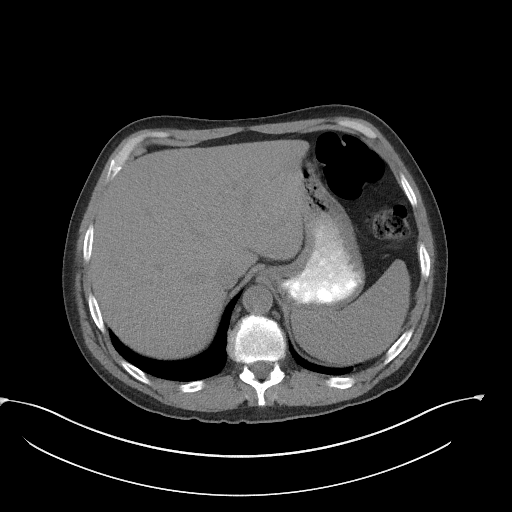
[im 105/111  soft-tissue]
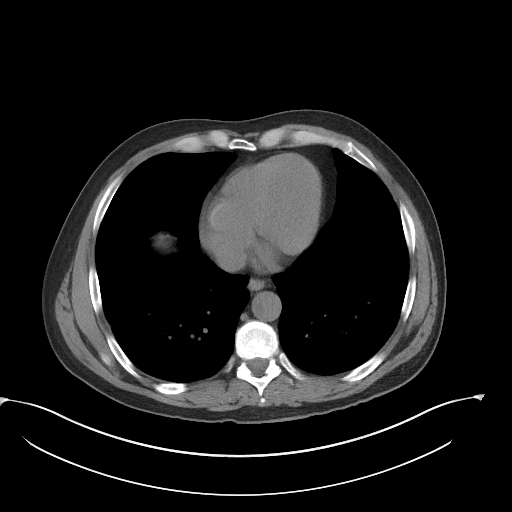

[Series 5: coronal · coronal · 0.74mm/px · 3 of 140 slices shown]
[im 35/140  soft-tissue]
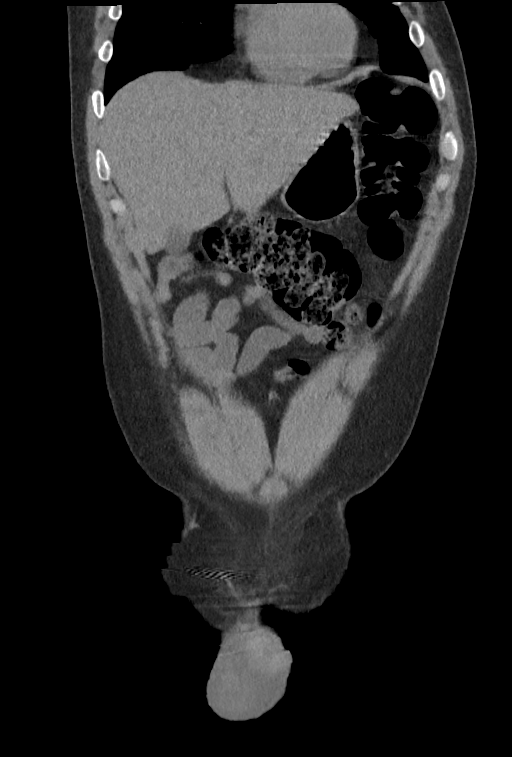
[im 70/140  soft-tissue]
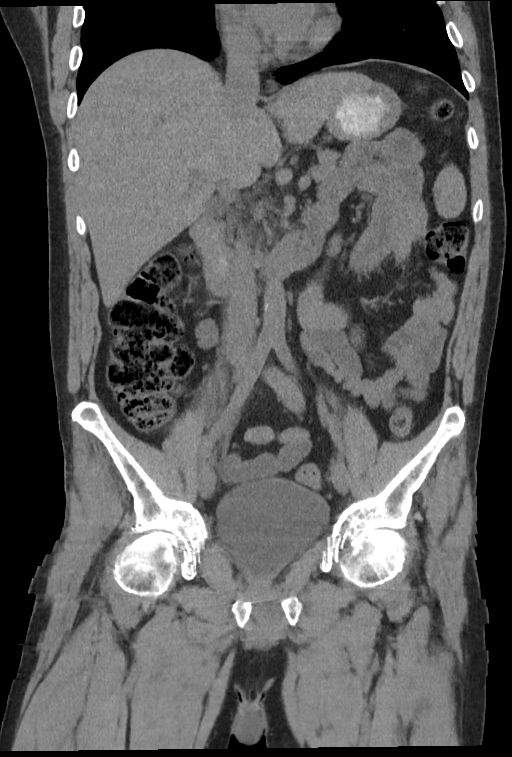
[im 105/140  soft-tissue]
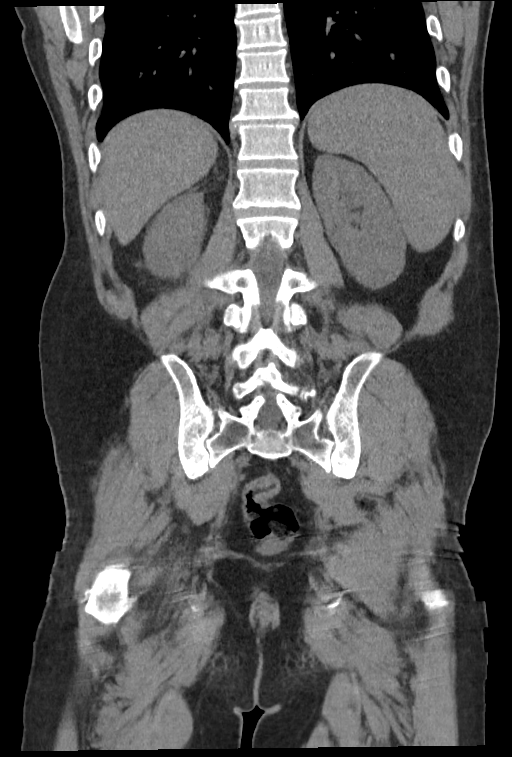

[15 of 46 positions shown; findings below may reference images not displayed]

FINDINGS: Lower chest: The lung bases are clear. The imaged heart is
unremarkable.

Hepatobiliary: The liver and gallbladder are unremarkable. There is
no biliary ductal dilatation.

Pancreas: Unremarkable.

Spleen: Unremarkable.

Adrenals/Urinary Tract: The adrenals are unremarkable.

There is a punctate nonobstructing right lower pole renal stone. No
other stones are seen in either kidney or along the course of either
ureter. There is no focal parenchymal lesions, within the confines
of noncontrast technique. There is no hydronephrosis or hydroureter.

There is stranding in the fat along the course of the right gonadal
vein which appears separate from the ureter (2-57). There is no
organized or drainable fluid collection.

The bladder is unremarkable.

Stomach/Bowel: The stomach is unremarkable.

There is no evidence of bowel obstruction. There is no abnormal
bowel wall thickening or inflammatory change. The appendix is not
definitively identified.

Vascular/Lymphatic: There is minimal calcified plaque in the
nonaneurysmal abdominal aorta. There is no abdominal or pelvic
lymphadenopathy.

Reproductive: The prostate and seminal vesicles are unremarkable.
Small bilateral hydroceles are noted.

Other: There is no ascites or free air.

Musculoskeletal: There is no acute osseous abnormality or suspicious
osseous lesion. There is degenerative change at L5-S1.
IMPRESSION: 1. Stranding in the right hemiabdomen centered around the right
gonadal veins which is suggestive of septic thrombophlebitis.
2. Punctate nonobstructing right renal stone. No obstructing stone
or hydroureteronephrosis.

## 2023-02-07 IMAGING — US US SCROTUM W/ DOPPLER COMPLETE
1 series · 14 of 25 positions shown · non-contrast
Comparison: None Available.

CLINICAL DATA: Testicular pain for 4 days.  Hematuria.

EXAM:
SCROTAL ULTRASOUND
DOPPLER ULTRASOUND OF THE TESTICLES
TECHNIQUE: Complete ultrasound examination of the testicles, epididymis, and
other scrotal structures was performed. Color and spectral Doppler
ultrasound were also utilized to evaluate blood flow to the
testicles.

[Series 1001: us scrotum w/doppler · 14 of 36 slices shown]
[im 1/36]
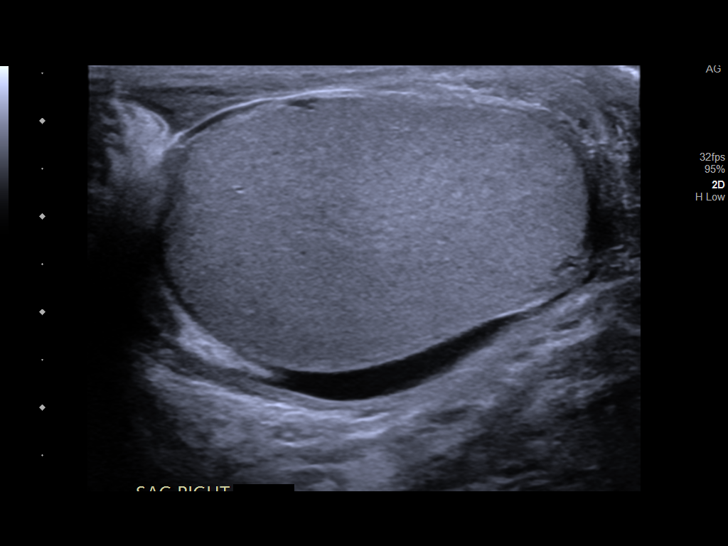
[im 3/36]
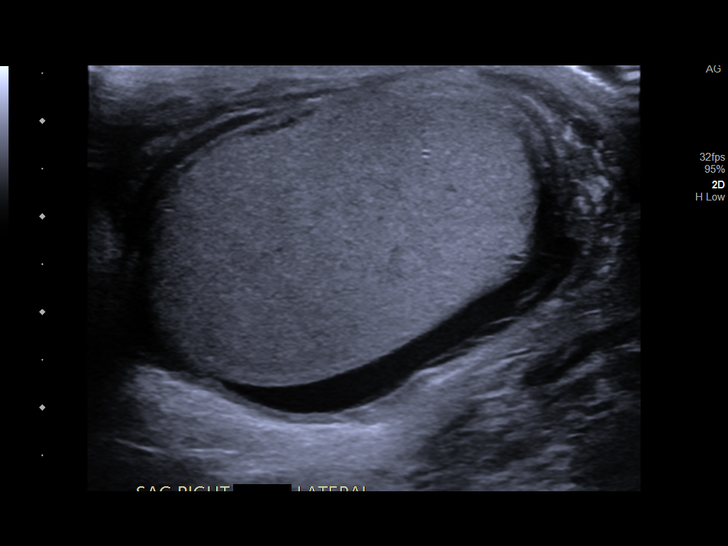
[im 6/36]
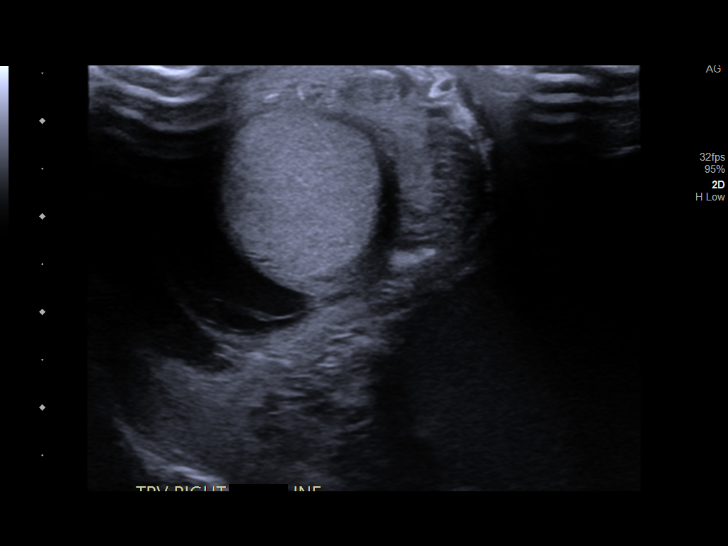
[im 9/36]
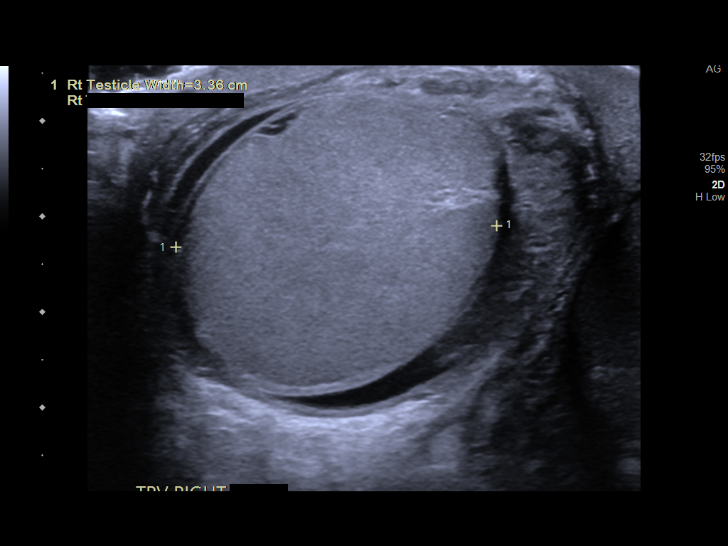
[im 12/36]
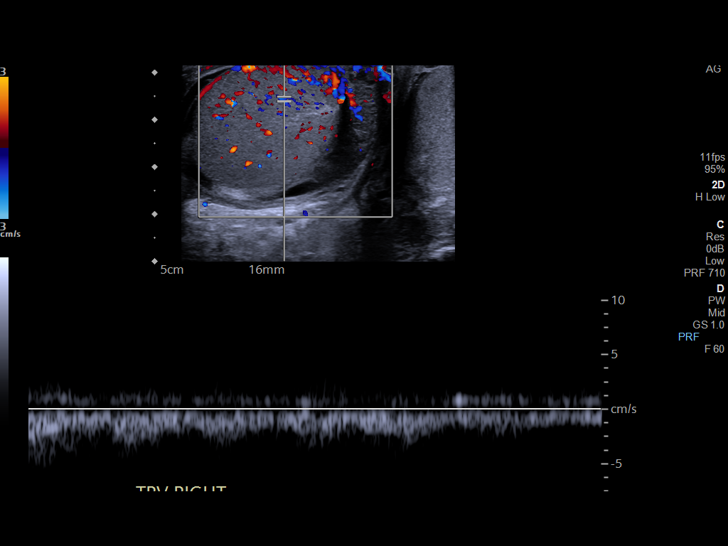
[im 14/36]
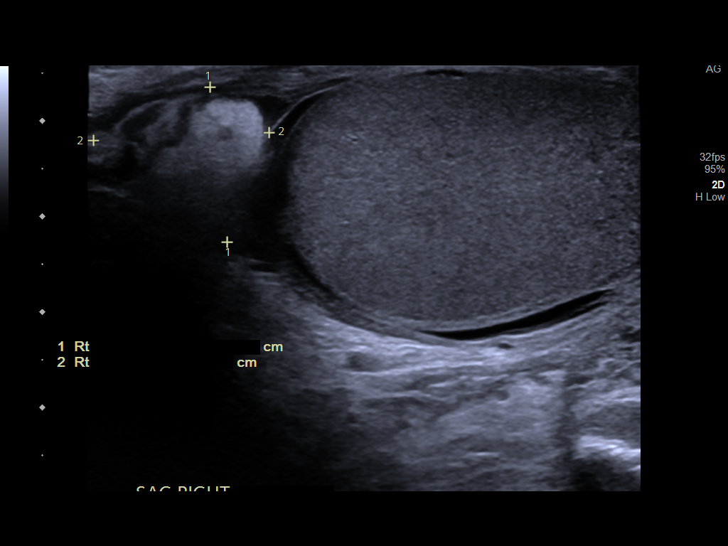
[im 17/36]
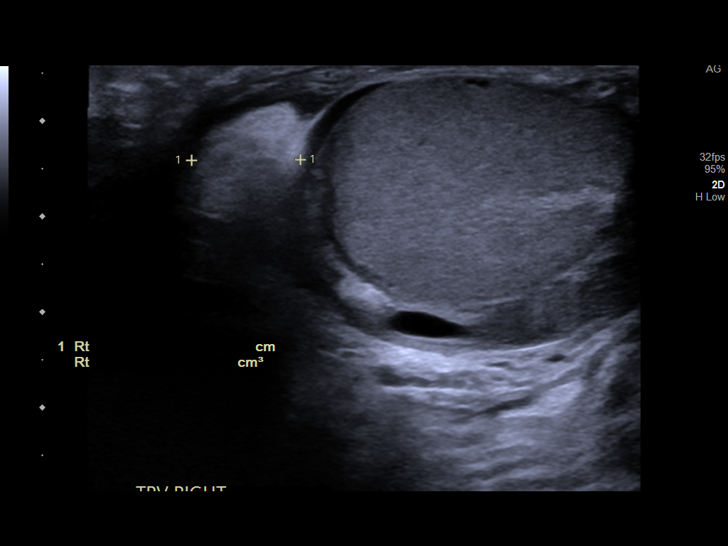
[im 19/36]
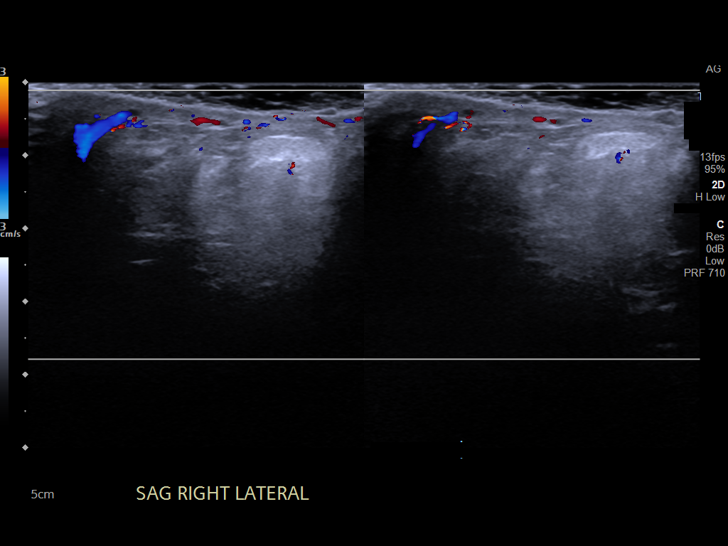
[im 22/36]
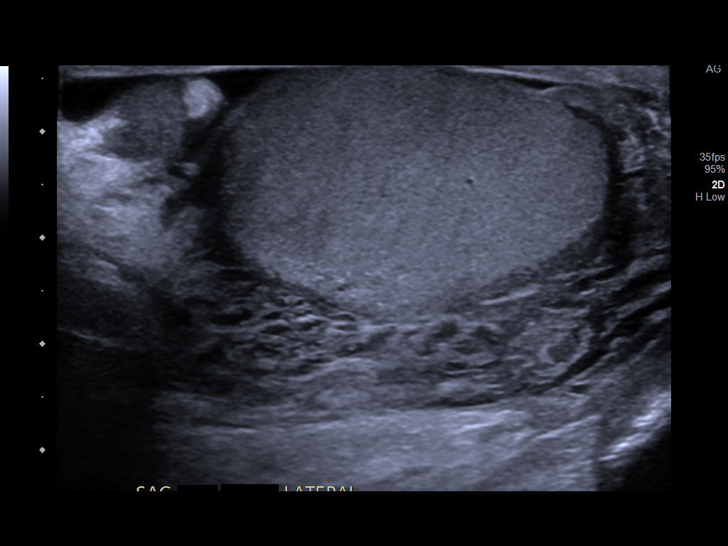
[im 24/36]
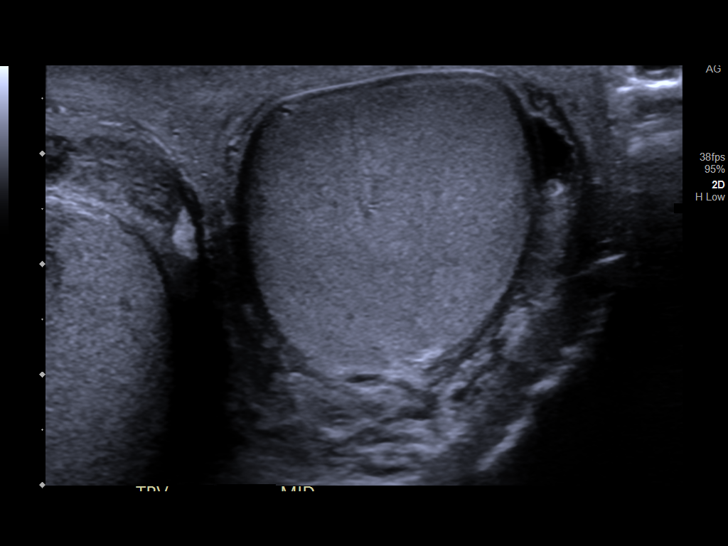
[im 27/36]
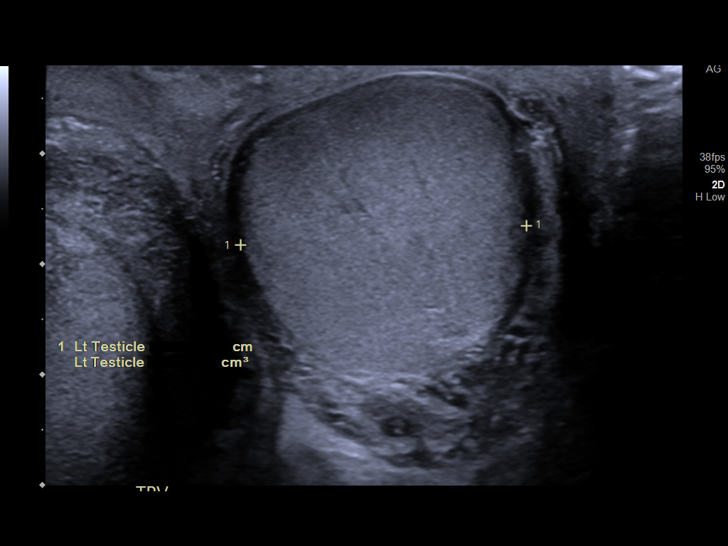
[im 30/36]
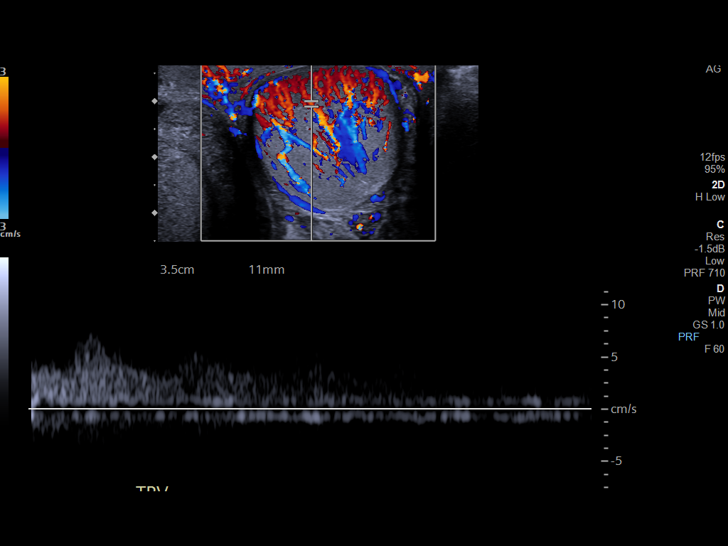
[im 33/36]
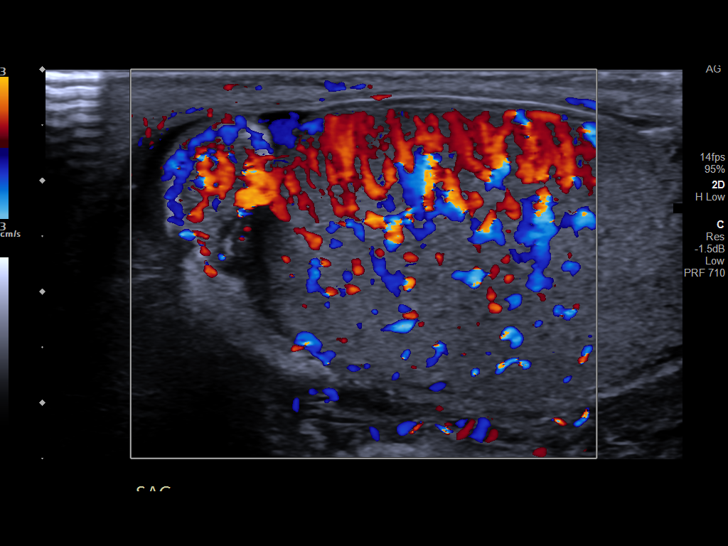
[im 36/36]
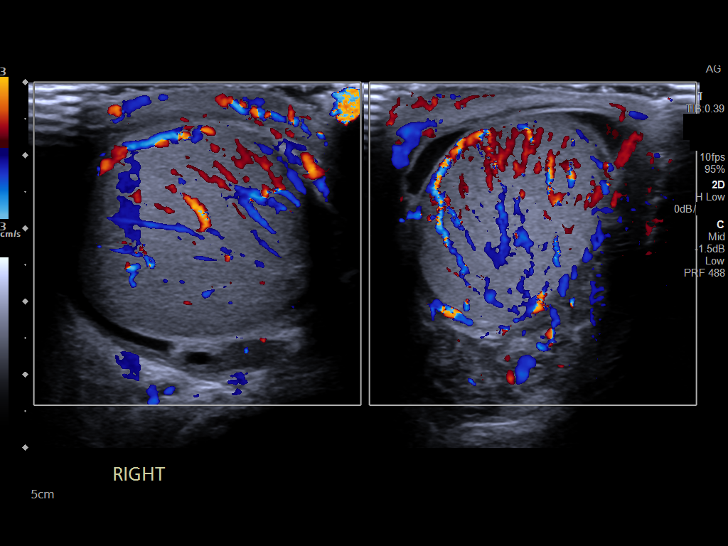

[14 of 25 positions shown; findings below may reference images not displayed]

FINDINGS: Right testicle

Measurements: 4.6 x 3.0 x 3.4 cm. No mass or microlithiasis
visualized. Increased vascularity.

Left testicle

Measurements: 4.5 x 2.6 x 2.6 cm. No mass or microlithiasis
visualized. Increased vascularity

Right epididymis:  Increased vascularity.

Left epididymis:  Increased vascularity.

Hydrocele:  Trace bilateral reactive hydroceles.

Varicocele:  None visualized.

Pulsed Doppler interrogation of both testes demonstrates normal low
resistance arterial and venous waveforms bilaterally.
IMPRESSION: 1. Increased vascularity of the bilateral testicles and
epididymides, consistent with bilateral epididymo-orchitis.

## 2024-03-05 ENCOUNTER — Other Ambulatory Visit: Payer: Self-pay

## 2024-03-05 ENCOUNTER — Emergency Department: Payer: MEDICAID

## 2024-03-05 ENCOUNTER — Emergency Department
Admission: EM | Admit: 2024-03-05 | Discharge: 2024-03-05 | Disposition: A | Discharge status: Home / Self Care | Payer: MEDICAID | Attending: Emergency Medicine | Admitting: Emergency Medicine

## 2024-03-05 DIAGNOSIS — E876 Hypokalemia: Secondary | ICD-10-CM | POA: Insufficient documentation

## 2024-03-05 DIAGNOSIS — L03115 Cellulitis of right lower limb: Secondary | ICD-10-CM | POA: Insufficient documentation

## 2024-03-05 DIAGNOSIS — L03116 Cellulitis of left lower limb: Secondary | ICD-10-CM | POA: Insufficient documentation

## 2024-03-05 LAB — CBC WITH DIFFERENTIAL/PLATELET
Abs Immature Granulocytes: 0.02 K/uL (ref 0.00–0.07)
Basophils Absolute: 0 K/uL (ref 0.0–0.1)
Basophils Relative: 1 %
Eosinophils Absolute: 0.1 K/uL (ref 0.0–0.5)
Eosinophils Relative: 1 %
HCT: 33.5 % — ABNORMAL LOW (ref 39.0–52.0)
Hemoglobin: 11.6 g/dL — ABNORMAL LOW (ref 13.0–17.0)
Immature Granulocytes: 0 %
Lymphocytes Relative: 23 %
Lymphs Abs: 1.6 K/uL (ref 0.7–4.0)
MCH: 30.1 pg (ref 26.0–34.0)
MCHC: 34.6 g/dL (ref 30.0–36.0)
MCV: 86.8 fL (ref 80.0–100.0)
Monocytes Absolute: 0.4 K/uL (ref 0.1–1.0)
Monocytes Relative: 6 %
Neutro Abs: 5.1 K/uL (ref 1.7–7.7)
Neutrophils Relative %: 69 %
Platelets: 217 K/uL (ref 150–400)
RBC: 3.86 MIL/uL — ABNORMAL LOW (ref 4.22–5.81)
RDW: 12.7 % (ref 11.5–15.5)
WBC: 7.3 K/uL (ref 4.0–10.5)
nRBC: 0 % (ref 0.0–0.2)

## 2024-03-05 LAB — COMPREHENSIVE METABOLIC PANEL WITH GFR
ALT: 15 U/L (ref 0–44)
AST: 19 U/L (ref 15–41)
Albumin: 4 g/dL (ref 3.5–5.0)
Alkaline Phosphatase: 85 U/L (ref 38–126)
Anion gap: 13 (ref 5–15)
BUN: 13 mg/dL (ref 6–20)
CO2: 24 mmol/L (ref 22–32)
Calcium: 9.3 mg/dL (ref 8.9–10.3)
Chloride: 103 mmol/L (ref 98–111)
Creatinine, Ser: 0.56 mg/dL — ABNORMAL LOW (ref 0.61–1.24)
GFR, Estimated: >60 mL/min (ref >60)
Glucose, Bld: 138 mg/dL — ABNORMAL HIGH (ref 70–99)
Potassium: 3 mmol/L — ABNORMAL LOW (ref 3.5–5.1)
Sodium: 140 mmol/L (ref 135–145)
Total Bilirubin: 0.5 mg/dL (ref 0.0–1.2)
Total Protein: 7.4 g/dL (ref 6.5–8.1)

## 2024-03-05 LAB — LACTIC ACID, PLASMA: Lactic Acid, Venous: 1.5 mmol/L (ref 0.5–1.9)

## 2024-03-05 MED ORDER — POTASSIUM CHLORIDE CRYS ER 20 MEQ PO TBCR: 20.0000 meq | EXTENDED_RELEASE_TABLET | Freq: Every day | ORAL | 0 refills | Status: AC

## 2024-03-05 MED ORDER — KETOROLAC TROMETHAMINE 10 MG PO TABS: 10.0000 mg | ORAL_TABLET | Freq: Four times a day (QID) | ORAL | 0 refills | Status: AC | PRN

## 2024-03-05 MED ORDER — CEPHALEXIN 500 MG PO CAPS: 500.0000 mg | ORAL_CAPSULE | Freq: Three times a day (TID) | ORAL | 0 refills | Status: AC

## 2024-03-05 MED ORDER — POTASSIUM CHLORIDE CRYS ER 20 MEQ PO TBCR
40.0000 meq | EXTENDED_RELEASE_TABLET | Freq: Once | ORAL | Status: AC
  Administered 2024-03-05: 40 meq via ORAL
  Filled 2024-03-05: qty 2

## 2024-03-05 MED ORDER — KETOROLAC TROMETHAMINE 15 MG/ML IJ SOLN
15.0000 mg | Freq: Once | INTRAMUSCULAR | Status: AC
  Administered 2024-03-05: 15 mg via INTRAVENOUS
  Filled 2024-03-05: qty 1

## 2024-03-05 MED ORDER — CLINDAMYCIN PHOSPHATE 600 MG/50ML IV SOLN
600.0000 mg | Freq: Once | INTRAVENOUS | Status: AC
  Administered 2024-03-05: 600 mg via INTRAVENOUS
  Filled 2024-03-05: qty 50

## 2024-03-05 NOTE — ED Triage Notes (Signed)
 Patient presents with pain and swelling to his bilateral ankles and feet. Areas are red and tender to touch. Both ankles appear to have a lesion that the patient states may be from bug bites. Patient states he was under a house doing some work and may have been bitten by something. Patient states he took tylenol  for  the pain.

## 2024-03-05 NOTE — Discharge Instructions (Addendum)
 Return to the emergency department if any severe worsening of your symptoms or urgent concerns.  A prescription for potassium tablets along with the antibiotic was sent to the pharmacy.  A prescription for Toradol  for the next 2 days was also sent to the pharmacy to take with food for inflammation and pain.  A referral was made for primary care provider and also a list of aches in this area are listed on your discharge papers.  Please go to the following website to schedule new (and existing) patient appointments:   [email or web address]   The following is a list of primary care offices in the area who are accepting new patients at this time.  Please reach out to one of them directly and let them know you would like to schedule an appointment to follow up on an Emergency Department visit, and/or to establish a new primary care provider (PCP).  There are likely other primary care clinics in the are who are accepting new patients, but this is an excellent place to start:  [facility] Lead physician: Dr Jon Eva [address] [place], [place] [address] [phone]  [facility] Lead Physician: Dr Dorette Loron [address], [place], [place] [address] [phone]  [facility]  Lead Physician: Dr Duwaine Louder [address] [place], [place] [address] [phone]  [facility] Lead Physician: Dr Marolyn Officer [address] [place], [place] [address] [phone]  [facility] Lead Physician: Dr Leita Adie [address] [place], [place], [place] [address] [phone]

## 2024-03-05 NOTE — ED Provider Notes (Signed)
 [facility] Provider Note    None    (approximate)   History   Foot Pain and Ankle Pain   HPI  Kenneth Day is a 48 y.o. male   presents to the ED with complaint of bilateral ankle pain with erythema and edema.  Patient states this started yesterday morning.  He reports that he had been under the house crawling around but did not actually see anything that would have bitten him.  He has 2 areas to his lower extremities that has continued to become red.  He denies any fever, chills, nausea, vomiting, dizziness or respiratory symptoms.  Patient has history of cellulitis.  Patient also had a history of substance abuse and was taking methadone .  He states that he discontinued the methadone  approximately 2 years ago.      Physical Exam   Triage Vital Signs: ED Triage Vitals [03/05/24 0644]  Encounter Vitals Group     BP (!) 137/94     Girls Systolic BP Percentile      Girls Diastolic BP Percentile      Boys Systolic BP Percentile      Boys Diastolic BP Percentile      Pulse Rate (!) 104     Resp 18     Temp 98.3 F (36.8 C)     Temp Source Oral     SpO2 100 %     Weight 170 lb (77.1 kg)     Height 6' (1.829 m)     Head Circumference      Peak Flow      Pain Score 10     Pain Loc      Pain Education      Exclude from Growth Chart     Most recent vital signs: Vitals:   03/05/24 0644 03/05/24 0745  BP: (!) 137/94 133/76  Pulse: (!) 104 86  Resp: 18 16  Temp: 98.3 F (36.8 C)   SpO2: 100% 100%     General: Awake, no distress.  Alert, talkative. CV:  Good peripheral perfusion.  Heart regular rate and rhythm. Resp:  Normal effort.  Lungs are clear bilaterally. Abd:  No distention.  Other:  Lateral aspect of the right ankle has a superficial wound without active drainage.  Surrounding cellulitis in this area.  Soft tissue edema noted to this area.  Above the cellulitic area there is some individual erythematous papules which are nontender  and no drainage from these areas.  Medial aspect of the left ankle with an area consistent with a insect bite with surrounding cellulitis.  Soft tissue edema in this area.  Pulses are present bilaterally distal to these areas.  Motor sensory function intact.   ED Results / Procedures / Treatments   Labs (all labs ordered are listed, but only abnormal results are displayed) Labs Reviewed  COMPREHENSIVE METABOLIC PANEL WITH GFR - Abnormal; Notable for the following components:      Result Value   Potassium 3.0 (*)    Glucose, Bld 138 (*)    Creatinine, Ser 0.56 (*)    All other components within normal limits  CBC WITH DIFFERENTIAL/PLATELET - Abnormal; Notable for the following components:   RBC 3.86 (*)    Hemoglobin 11.6 (*)    HCT 33.5 (*)    All other components within normal limits  LACTIC ACID, PLASMA      RADIOLOGY Left ankle x-ray images were reviewed and interpreted by myself independent of the radiologist  is negative for bony abnormality. Right ankle x-ray images were reviewed and interpreted by myself independent of the radiologist and was negative for bony abnormality.    PROCEDURES:  Critical Care performed:   Procedures   MEDICATIONS ORDERED IN ED: Medications  clindamycin  (CLEOCIN ) IVPB 600 mg (0 mg Intravenous Stopped 03/05/24 0911)  potassium chloride  SA (KLOR-CON  M) CR tablet 40 mEq (40 mEq Oral Given 03/05/24 0732)  ketorolac  (TORADOL ) 15 MG/ML injection 15 mg (15 mg Intravenous Given 03/05/24 0734)     IMPRESSION / MDM / ASSESSMENT AND PLAN / ED COURSE  I reviewed the triage vital signs and the nursing notes.   Differential diagnosis includes, but is not limited to, insect bite, folliculitis, cellulitis, contact dermatitis.  48 year old male presents to the ED with complaint of bilateral leg rash and redness.  Patient states that he was under the house crawling around and possibly was bitten by some spiders but did not actually see a bite.  He does have  2 distinguished areas on his lower extremity as noted above with erythema.  Patient was given Toradol  15 mg IV while in the ED which helped with his pain.  We discussed continuation of the NSAIDs due to his problems with opioids in the past.  Also noted was his potassium was slightly low at 3.0 and he was given K. Dur p.o. while in the ED.  Lactic acid was 1.5 and remaining lab work was reassuring.  Patient was given clindamycin  600 mg IV while in the ED and a prescription for Keflex  was sent to the pharmacy along with a continued prescription for K-Dur to be taken daily.  Patient does not have a PCP at this time.  Referral was placed for continuing management and also a list of clinics in the area was printed on his discharge papers.  Patient acknowledges that he needs to have a PCP.      Patient's presentation is most consistent with acute complicated illness / injury requiring diagnostic workup.  FINAL CLINICAL IMPRESSION(S) / ED DIAGNOSES   Final diagnoses:  Cellulitis of right ankle  Cellulitis of left ankle  Hypokalemia     Rx / DC Orders   ED Discharge Orders          Ordered    Ambulatory Referral to Primary Care (Establish Care)       Comments: Cellulitis and hypokalemia   03/05/24 0846    potassium chloride  SA (KLOR-CON  M) 20 MEQ tablet  Daily        03/05/24 0850    cephALEXin  (KEFLEX ) 500 MG capsule  3 times daily        03/05/24 0850    ketorolac  (TORADOL ) 10 MG tablet  Every 6 hours PRN        03/05/24 0850             Note:  This document was prepared using Dragon voice recognition software and may include unintentional dictation errors.   Saunders Shona CROME, PA-C 03/05/24 1005    Suzanne Kirsch, MD 03/05/24 1339

## 2024-03-18 ENCOUNTER — Emergency Department
Admission: EM | Admit: 2024-03-18 | Discharge: 2024-03-18 | Discharge status: Against Medical Advice | Payer: Self-pay | Attending: Emergency Medicine | Admitting: Emergency Medicine

## 2024-03-18 ENCOUNTER — Encounter: Payer: Self-pay | Admitting: Intensive Care

## 2024-03-18 ENCOUNTER — Other Ambulatory Visit: Payer: Self-pay

## 2024-03-18 DIAGNOSIS — Z5329 Procedure and treatment not carried out because of patient's decision for other reasons: Secondary | ICD-10-CM | POA: Insufficient documentation

## 2024-03-18 DIAGNOSIS — W260XXA Contact with knife, initial encounter: Secondary | ICD-10-CM | POA: Insufficient documentation

## 2024-03-18 DIAGNOSIS — S61512A Laceration without foreign body of left wrist, initial encounter: Secondary | ICD-10-CM | POA: Insufficient documentation

## 2024-03-18 DIAGNOSIS — Y92000 Kitchen of unspecified non-institutional (private) residence as  the place of occurrence of the external cause: Secondary | ICD-10-CM | POA: Insufficient documentation

## 2024-03-18 DIAGNOSIS — S56922A Laceration of unspecified muscles, fascia and tendons at forearm level, left arm, initial encounter: Secondary | ICD-10-CM | POA: Insufficient documentation

## 2024-03-18 MED ORDER — LIDOCAINE HCL (PF) 1 % IJ SOLN: 5.0000 mL | Freq: Once | INTRAMUSCULAR | Status: DC

## 2024-03-18 MED ORDER — ACETAMINOPHEN 500 MG PO TABS
1000.0000 mg | ORAL_TABLET | Freq: Once | ORAL | Status: AC
  Administered 2024-03-18: 1000 mg via ORAL
  Filled 2024-03-18: qty 2

## 2024-03-18 MED ORDER — LIDOCAINE-EPINEPHRINE-TETRACAINE (LET) TOPICAL GEL
3.0000 mL | Freq: Once | TOPICAL | Status: AC
  Administered 2024-03-18: 3 mL via TOPICAL
  Filled 2024-03-18: qty 3

## 2024-03-18 MED ORDER — CEPHALEXIN 500 MG PO CAPS
500.0000 mg | ORAL_CAPSULE | Freq: Once | ORAL | Status: AC
  Administered 2024-03-18: 500 mg via ORAL
  Filled 2024-03-18: qty 1

## 2024-03-18 NOTE — ED Triage Notes (Signed)
 Patient presents with large laceration to left forearm that happened last night. Bleeding controlled.

## 2024-03-18 NOTE — Discharge Instructions (Signed)
Patient left AMA without telling anyone.
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
## 2024-03-18 NOTE — ED Notes (Signed)
 Provider went to the room to suture pts laceration and pt was not in room. RN went to waiting area to look for pt and first nurse visualize pt leaving ED. Pt left ED with LET on laceration. [place], [place] notified.

## 2024-03-20 ENCOUNTER — Telehealth: Payer: Self-pay | Admitting: Emergency Medicine

## 2024-03-20 NOTE — Telephone Encounter (Signed)
 Called patient due to left emergency department before provider exam to inquire about condition and follow up plans. The number is his step mother. She says she has not seen him since his dad passed away.  She says she does not know where he lives or anything ,but says she will let him know I called if she does see him.
